# Patient Record
Sex: Female | Born: 1975
Health system: Southern US, Community
[De-identification: ages and names within clinical notes are randomized; demographics above are authoritative.]

## PROBLEM LIST (undated history)

## (undated) DIAGNOSIS — D573 Sickle-cell trait: Secondary | ICD-10-CM

## (undated) DIAGNOSIS — O139 Gestational [pregnancy-induced] hypertension without significant proteinuria, unspecified trimester: Secondary | ICD-10-CM

---

## 2016-03-02 ENCOUNTER — Other Ambulatory Visit (HOSPITAL_COMMUNITY): Payer: Self-pay | Admitting: Obstetrics and Gynecology

## 2016-03-02 ENCOUNTER — Encounter (HOSPITAL_COMMUNITY): Payer: Self-pay | Admitting: Obstetrics and Gynecology

## 2016-03-02 DIAGNOSIS — O09522 Supervision of elderly multigravida, second trimester: Secondary | ICD-10-CM

## 2016-03-02 DIAGNOSIS — E669 Obesity, unspecified: Secondary | ICD-10-CM

## 2016-03-02 DIAGNOSIS — Z3689 Encounter for other specified antenatal screening: Secondary | ICD-10-CM

## 2016-03-02 DIAGNOSIS — Z3A19 19 weeks gestation of pregnancy: Secondary | ICD-10-CM

## 2016-03-11 ENCOUNTER — Ambulatory Visit (HOSPITAL_COMMUNITY): Payer: Self-pay

## 2016-03-13 ENCOUNTER — Encounter (HOSPITAL_COMMUNITY): Payer: Self-pay

## 2016-03-13 ENCOUNTER — Ambulatory Visit (HOSPITAL_COMMUNITY)
Admission: RE | Admit: 2016-03-13 | Discharge: 2016-03-13 | Disposition: A | Discharge status: Home / Self Care | Payer: Medicaid Other | Source: Ambulatory Visit | Attending: Obstetrics and Gynecology | Admitting: Obstetrics and Gynecology

## 2016-03-13 DIAGNOSIS — Z3689 Encounter for other specified antenatal screening: Secondary | ICD-10-CM

## 2016-03-13 DIAGNOSIS — O99212 Obesity complicating pregnancy, second trimester: Secondary | ICD-10-CM | POA: Diagnosis not present

## 2016-03-13 DIAGNOSIS — Z315 Encounter for genetic counseling: Secondary | ICD-10-CM | POA: Diagnosis not present

## 2016-03-13 DIAGNOSIS — O09512 Supervision of elderly primigravida, second trimester: Secondary | ICD-10-CM | POA: Diagnosis not present

## 2016-03-13 DIAGNOSIS — E669 Obesity, unspecified: Secondary | ICD-10-CM

## 2016-03-13 DIAGNOSIS — Z3A19 19 weeks gestation of pregnancy: Secondary | ICD-10-CM | POA: Insufficient documentation

## 2016-03-13 DIAGNOSIS — O09522 Supervision of elderly multigravida, second trimester: Secondary | ICD-10-CM

## 2016-03-13 DIAGNOSIS — O09519 Supervision of elderly primigravida, unspecified trimester: Secondary | ICD-10-CM

## 2016-03-13 HISTORY — DX: Gestational (pregnancy-induced) hypertension without significant proteinuria, unspecified trimester: O13.9

## 2016-03-13 HISTORY — DX: Sickle-cell trait: D57.3

## 2016-03-13 IMAGING — US US MFM OB DETAIL+14 WK
1 series · 14 of 28 positions shown · non-contrast
Comparison: none

[Series 1: us mfm ob detail+14 wk · 104 acquisitions, 14 frames shown]
[im 4/104]
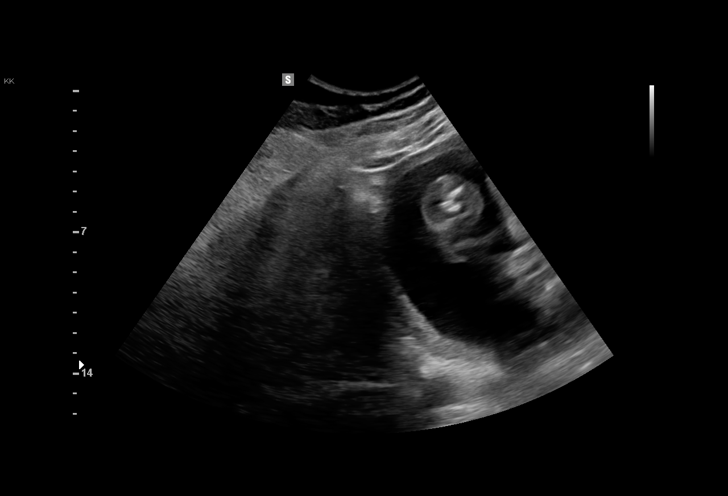
[im 12/104]
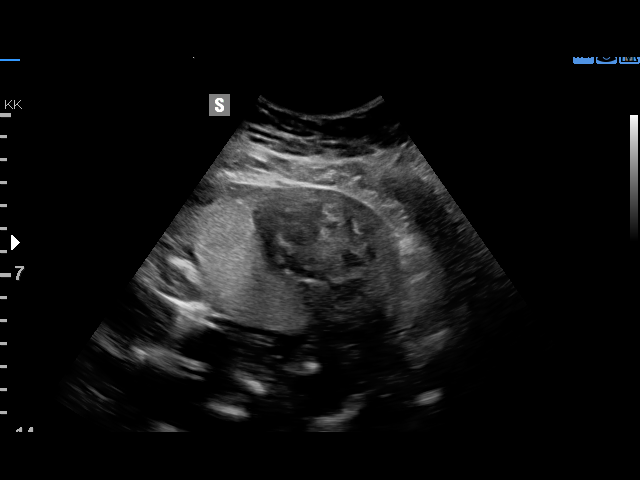
[im 20/104]
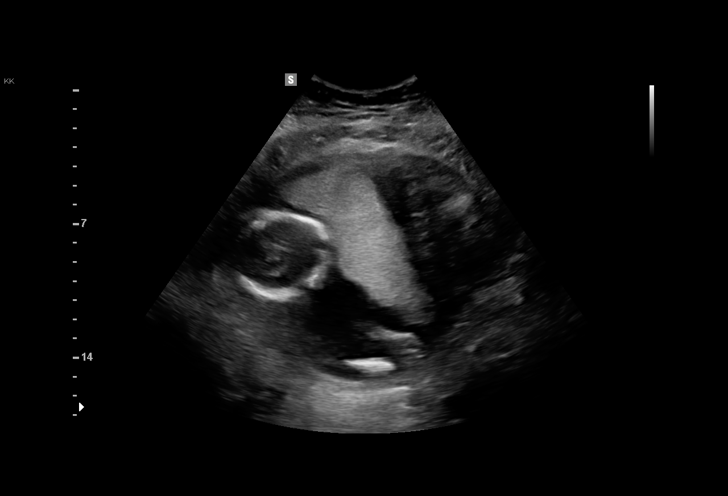
[im 27/104]
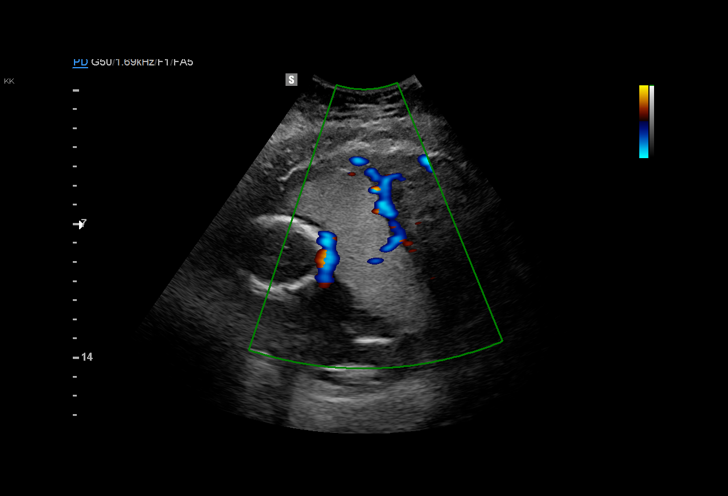
[im 35/104]
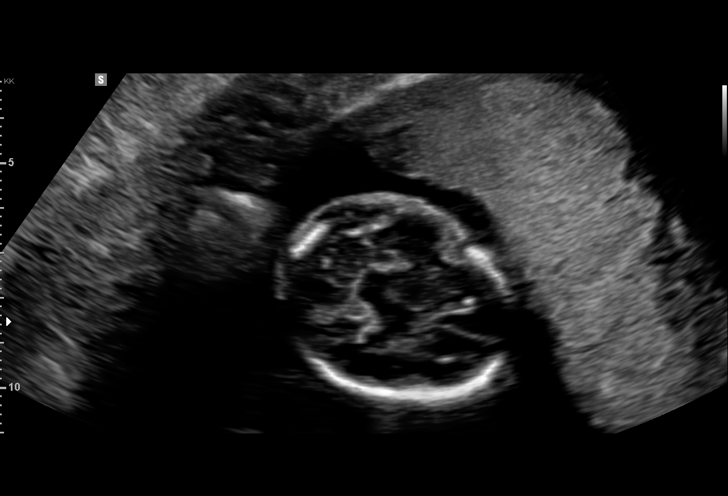
[im 42/104]
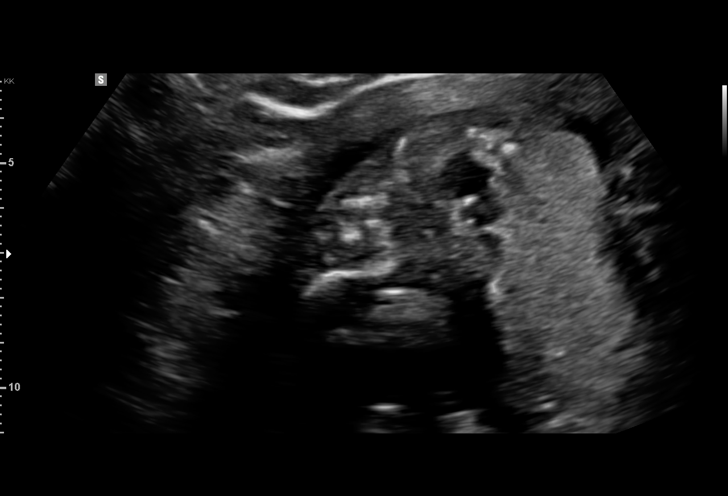
[im 50/104]
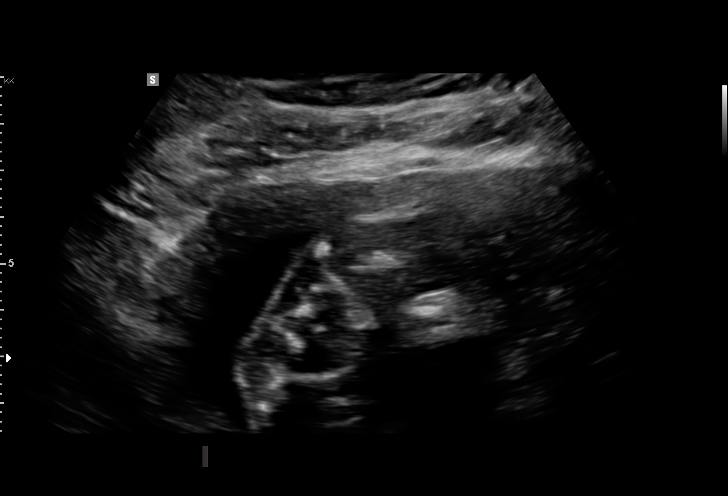
[im 58/104]
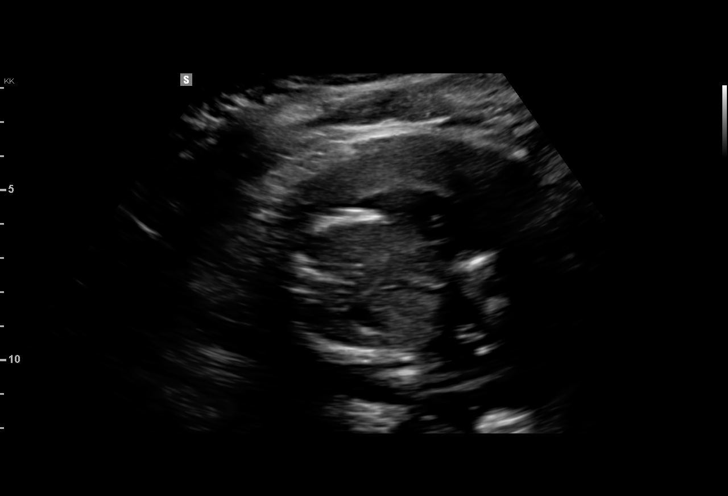
[im 65/104]
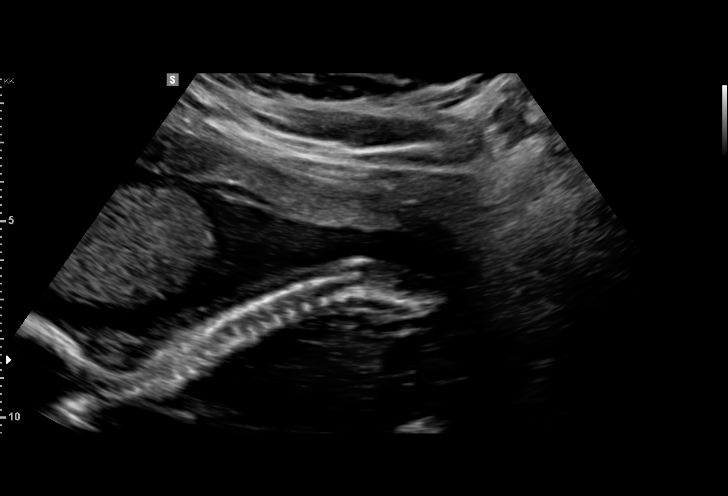
[im 73/104]
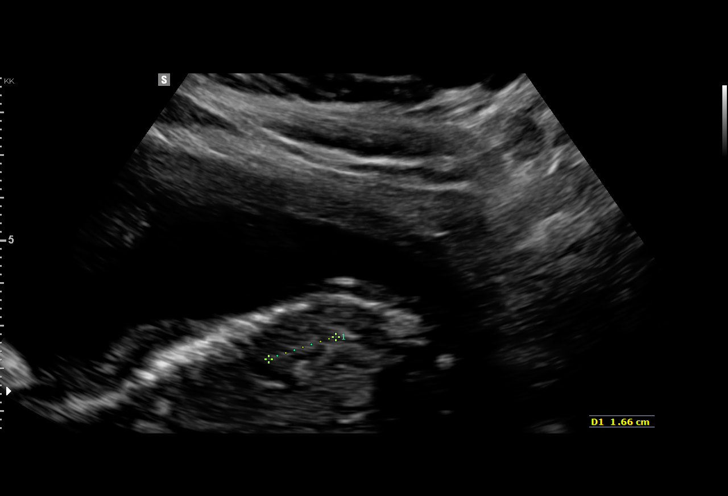
[im 81/104]
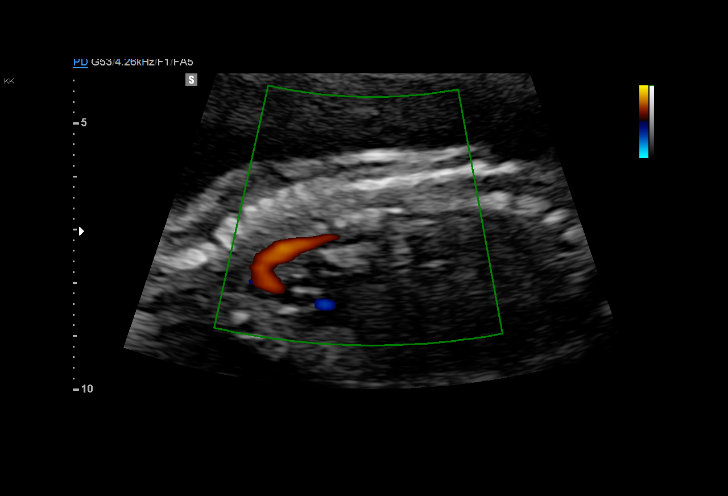
[im 88/104]
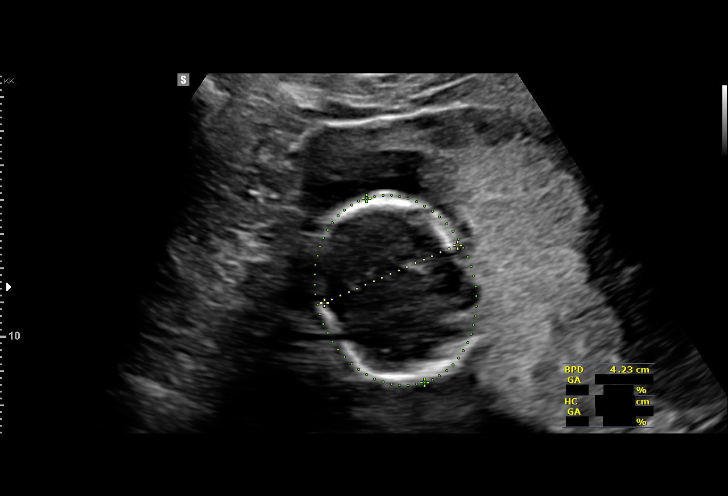
[im 96/104]
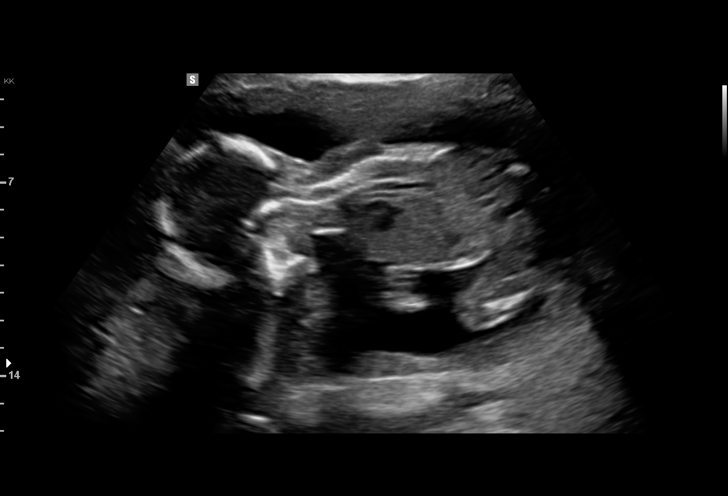
[im 104/104]
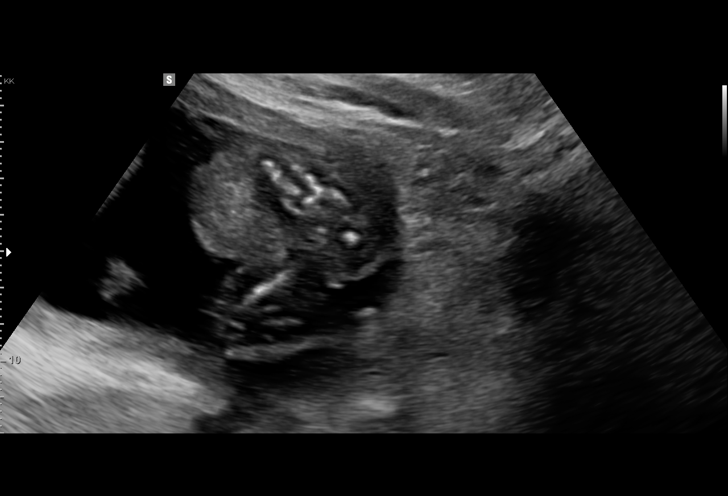

[14 of 28 positions shown; findings below may reference images not displayed]

1  AWEL KEDIR MAHAMEDI           [id number]      [id number]     [id number]
Indications

19 weeks gestation of pregnancy
Obesity complicating pregnancy, second
trimester
History of sickle cell trait
Advanced maternal age primigravida 35+,
second trimester
OB History

Blood Type:            Height:  5'4"   Weight (lb):  344      BMI:
Gravidity:    1
Fetal Evaluation

Num Of Fetuses:     1
Fetal Heart         162
Rate(bpm):
Cardiac Activity:   Observed
Presentation:       Breech
Placenta:           Anterior, above cervical os
P. Cord Insertion:  Visualized

Amniotic Fluid
AFI FV:      Subjectively within normal limits

Largest Pocket(cm)
6.2
Biometry

BPD:      42.4  mm     G. Age:  18w 6d         21  %    CI:        70.79   %   70 - 86
FL/HC:      18.5   %   16.8 -
HC:      160.6  mm     G. Age:  18w 6d         15  %    HC/AC:      1.06       1.09 -
AC:      151.5  mm     G. Age:  20w 2d         71  %    FL/BPD:     70.0   %
FL:       29.7  mm     G. Age:  19w 1d         28  %    FL/AC:      19.6   %   20 - 24
HUM:      26.6  mm     G. Age:  18w 3d         21  %
NFT:       2.6  mm

Est. FW:     307  gm    0 lb 11 oz      50  %
Gestational Age

LMP:           19w 4d       Date:   10/28/15                 EDD:   08/03/16
U/S Today:     19w 2d                                        EDD:   08/05/16
Best:          19w 4d    Det. By:   LMP  (10/28/15)          EDD:   08/03/16
Anatomy

Cranium:               Appears normal         Aortic Arch:            Appears normal
Cavum:                 Not well visualized    Ductal Arch:            Not well visualized
Ventricles:            Appears normal         Diaphragm:              Appears normal
Choroid Plexus:        Appears normal         Stomach:                Appears normal, left
sided
Cerebellum:            Appears normal         Abdomen:                Appears normal
Posterior Fossa:       Appears normal         Abdominal Wall:         Not well visualized
Nuchal Fold:           Appears normal         Cord Vessels:           Appears normal (3
vessel cord)
Face:                  Not well visualized    Kidneys:                Appear normal
Lips:                  Not well visualized    Bladder:                Appears normal
Heart:                 Not well visualized    Spine:                  Appears normal
RVOT:                  Not well visualized    Upper Extremities:      Not well visualized
LVOT:                  Not well visualized    Lower Extremities:      Not well visualized

Other:  Technically difficult due to maternal habitus and fetal position.
Technically difficult due to early gestational age.
Cervix Uterus Adnexa

Cervix
Length:            3.6  cm.
Normal appearance by transabdominal scan.

Adnexa:       No abnormality visualized.
Myomas

Site                     L(cm)      W(cm)      D(cm)      Location
Anterior                 4.6        3.5        5
Anterior

Blood Flow                 RI        PI       Comments

Impression

Single IUP at 19w 4d
AMA, Obesity, elevated HCG on quad screen
Limited views of the fetal heart and face obtained
No gross anomalies noted
Ultrasound measurements are consistent with LMP
Uterine myomas noted as described above
Anterior placenta without previa
Normal amniotic fluid volume
Recommendations

Recommend follow-up ultrasound examination in 4 weeks to
reevalaute the fetal anatomy
Serial ultrasounds for growth due to elevatated HCG on quad
screen

## 2016-03-13 NOTE — Progress Notes (Signed)
Genetic Counseling  High-Risk Gestation Note  Appointment Date:  03/13/2016 Referred By: Armandina Stammer, MD Date of Birth:  Jul 04, 1976 Partner:  Cynthia Luna   Pregnancy History: G1P0 Estimated Date of Delivery: 08/03/16 Estimated Gestational Age: [redacted]w[redacted]d Attending: Benjaman Lobe, MD   Mrs. Cynthia Luna, and her husband, Mr. Cynthia Luna, were seen for genetic counseling regarding a maternal age of 40 y.o..  In summary:  Discussed AMA and associated risk for fetal aneuploidy  Reviewed results of Quad screen within normal range and reduced risks for fetal Down syndrome and Trisomy 18  Elevated hCG on Quad screen; offer third trimester growth ultrasound  Discussed additional options for screening  NIPS-patient declined  Ultrasound-performed today; Complete report under separate cover  Discussed diagnostic testing options  Amniocentesis-declined  Reviewed family history concerns- patient reported she has sickle cell trait  Father of the pregnancy reported that he had normal screening  Reviewed autosomal recessive inheritance of sickle cell anemia  Hemoglobin electrophoresis available to assess for hemoglobin variants in addition to hemoglobin S; Mr. Wyszynski declined today  Couple is comfortable with newborn screening for hemoglobinopathies   They were counseled regarding maternal age and the association with risk for chromosome conditions due to nondisjunction with aging of the ova.   We reviewed chromosomes, nondisjunction, and the associated 1 in 68 risk for fetal aneuploidy related to a maternal age of 40 y.o. at [redacted]w[redacted]d gestation.  They were counseled that the risk for aneuploidy decreases as gestational age increases, accounting for those pregnancies which spontaneously abort.  We specifically discussed Down syndrome (trisomy 45), trisomies 63 and 60, and sex chromosome aneuploidies (47,XXX and 47,XXY) including the common features and prognoses of each.   We reviewed the  results of Quad screening, which were previously performed and were within normal range. We discussed the associated reduction in risks for fetal Down syndrome (1 in 6,098), trisomy 18 (< 1 in 10,000), and open neural tube defects. They were counseled that screening tests are used to modify a patient's a priori risk for aneuploidy, typically based on age. This estimate provides a pregnancy specific risk assessment. We reviewed that this is not diagnostic and does not assess for all chromosome conditions.   We reviewed additional available screening options including noninvasive prenatal screening (NIPS)/cell free DNA (cfDNA) screening and detailed ultrasound.  They were counseled that screening tests are used to modify a patient's a priori risk for aneuploidy, typically based on age. This estimate provides a pregnancy specific risk assessment. We reviewed the benefits and limitations of each option. Specifically, we discussed the conditions for which each test screens, the detection rates, and false positive rates of each. They were also counseled regarding diagnostic testing via amniocentesis. We reviewed the approximate 1 in [id number] risk for complications from amniocentesis, including spontaneous pregnancy loss. We discussed the possible results that the tests might provide including: positive, negative, unanticipated, and no result. Finally, they were counseled regarding the cost of each option and potential out of pocket expenses. After consideration of all the options, they declined NIPS and amniocentesis.     A detailed ultrasound was performed today. The ultrasound report will be sent under separate cover. They understand that screening tests cannot rule out all birth defects or genetic syndromes. The patient was advised of this limitation and states she still does not want additional testing at this time.   Mrs. Cynthia Luna was provided with written information regarding sickle cell anemia (SCA)  including the carrier frequency and incidence in  the African-American population, the availability of carrier testing and prenatal diagnosis if indicated.  In addition, we discussed that hemoglobinopathies are routinely screened for as part of the [facility] newborn screening panel.  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 The father of the pregnancy reported that he had normal screening. We do not have medical records to confirm his report and to confirm if this was screening only for sickle cell trait (Hb S) or for additional hemoglobin variants.  We discussed that SCA affects the shape and function of the red blood cell by producing abnormal hemoglobin. They were counseled that hemoglobin is a protein in the RBCs that carries oxygen to the body's organs. Individuals who have SCA have a changes within the genes that codes for hemoglobin. This couple was counseled that SCA is inherited in an autosomal recessive manner, and occurs when both copies of the hemoglobin gene are changed and produce an abnormal hemoglobin S. Typically, one abnormal gene for the production of hemoglobin S is inherited from each parent. A carrier of SCA has one altered copy of the gene for hemoglobin and one typical working copy. Carriers of recessive conditions typically do not have symptoms related to the condition because they still have one functioning copy of the gene, and thus some production of the typical protein coded for by that gene.  Given the recessive inheritance, we discussed the importance of understanding Mr. Haft' carrier status in order to accurately predict the risk of a hemoglobinopathy in the fetus. We discussed that other hemoglobin variants (hemoglobin C), when combined with hemoglobin S, can cause a medically significant hemoglobinopathy. Mr. Recchia was offered the option of repeat testing (hemoglobin electrophoresis) to determine whether he has any hemoglobin variant, including SCT. He declined  this testing today. They understand that an accurate risk assessment cannot be performed without further information. However, assuming that Mr. Bolle does not have SCT or any other hemoglobin variant, the fetus would not be expected to have an increased risk for a hemoglobinopathy. We also reviewed the availability of newborn screening in [place] for hemoglobinopathies.   Both family histories were reviewed and found to be otherwise noncontributory for birth defects, intellectual disability, and known genetic conditions. Without further information regarding the provided family history, an accurate genetic risk cannot be calculated. Further genetic counseling is warranted if more information is obtained.  Mrs. Cynthia Luna denied exposure to environmental toxins or chemical agents. She denied the use of alcohol, tobacco or street drugs. She denied significant viral illnesses during the course of her pregnancy. Her medical and surgical histories were contributory for hypertension, for which she is currently treated with labetalol.   I counseled this couple regarding the above risks and available options.  The approximate face-to-face time with the genetic counselor was 45 minutes.  Chipper Oman, MS,  Certified Genetic Counselor 03/13/2016

## 2016-03-17 ENCOUNTER — Other Ambulatory Visit (HOSPITAL_COMMUNITY): Payer: Self-pay

## 2016-03-17 DIAGNOSIS — O09519 Supervision of elderly primigravida, unspecified trimester: Secondary | ICD-10-CM

## 2016-04-10 ENCOUNTER — Ambulatory Visit (HOSPITAL_COMMUNITY)
Admission: RE | Admit: 2016-04-10 | Discharge: 2016-04-10 | Disposition: A | Discharge status: Home / Self Care | Payer: Medicaid Other | Source: Ambulatory Visit | Attending: Obstetrics and Gynecology | Admitting: Obstetrics and Gynecology

## 2016-04-10 ENCOUNTER — Encounter (HOSPITAL_COMMUNITY): Payer: Self-pay

## 2016-04-10 VITALS — BP 117/86 | HR 92 | Wt 346.5 lb

## 2016-04-10 DIAGNOSIS — O3412 Maternal care for benign tumor of corpus uteri, second trimester: Secondary | ICD-10-CM | POA: Diagnosis not present

## 2016-04-10 DIAGNOSIS — O281 Abnormal biochemical finding on antenatal screening of mother: Secondary | ICD-10-CM | POA: Diagnosis not present

## 2016-04-10 DIAGNOSIS — O09512 Supervision of elderly primigravida, second trimester: Secondary | ICD-10-CM | POA: Diagnosis present

## 2016-04-10 DIAGNOSIS — O99212 Obesity complicating pregnancy, second trimester: Secondary | ICD-10-CM | POA: Diagnosis not present

## 2016-04-10 DIAGNOSIS — D259 Leiomyoma of uterus, unspecified: Secondary | ICD-10-CM | POA: Diagnosis not present

## 2016-04-10 DIAGNOSIS — Z3A23 23 weeks gestation of pregnancy: Secondary | ICD-10-CM | POA: Diagnosis not present

## 2016-04-10 DIAGNOSIS — O09519 Supervision of elderly primigravida, unspecified trimester: Secondary | ICD-10-CM

## 2016-04-10 DIAGNOSIS — Z862 Personal history of diseases of the blood and blood-forming organs and certain disorders involving the immune mechanism: Secondary | ICD-10-CM | POA: Insufficient documentation

## 2016-04-10 IMAGING — US US MFM OB FOLLOW-UP
1 series · 14 of 28 positions shown · non-contrast
Comparison: none

[Series 1: us mfm ob follow-up · 61 acquisitions, 14 frames shown]
[im 3/61]
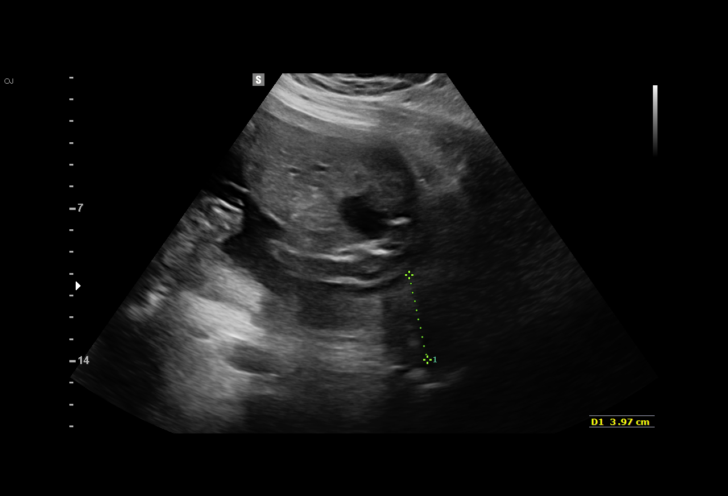
[im 7/61]
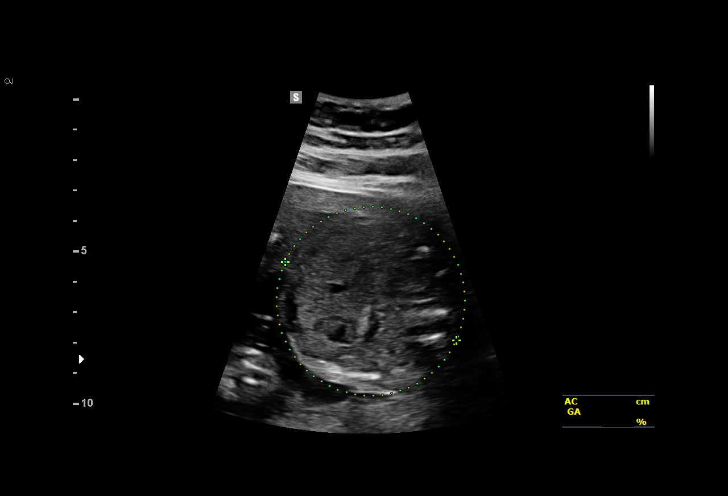
[im 12/61]
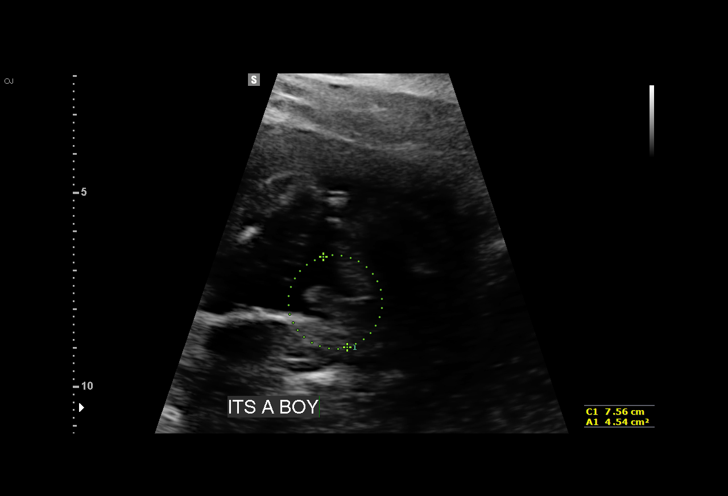
[im 16/61]
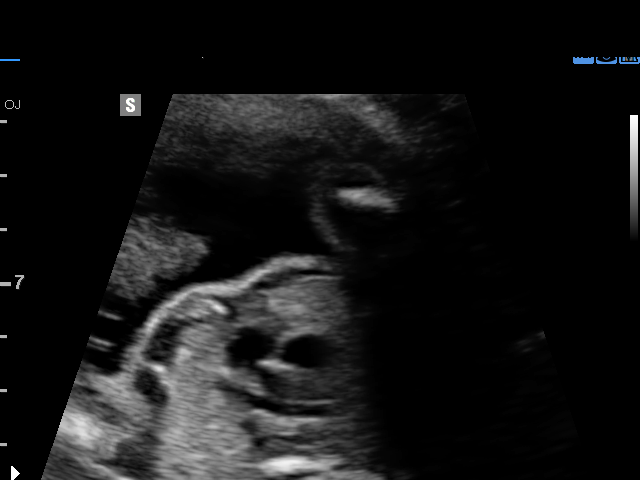
[im 21/61]
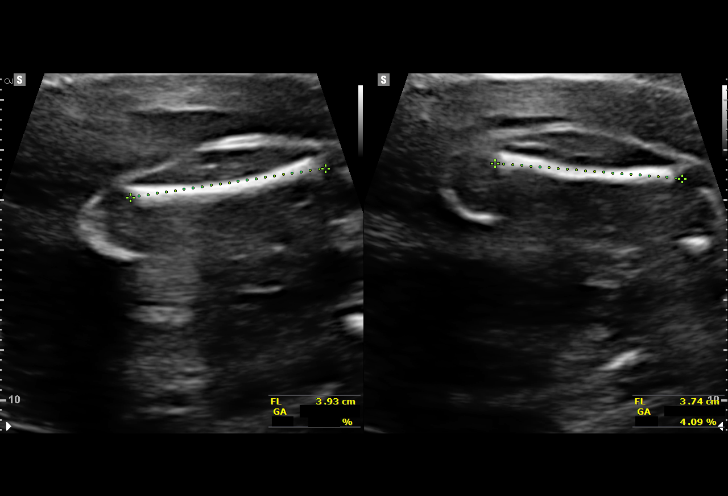
[im 25/61]
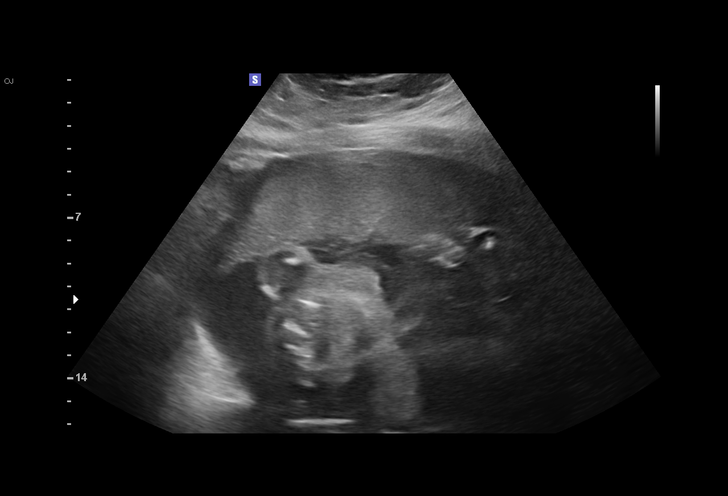
[im 29/61]
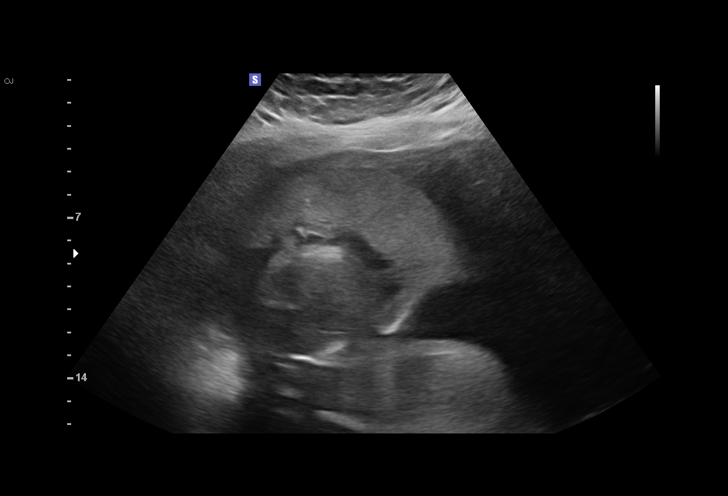
[im 34/61]
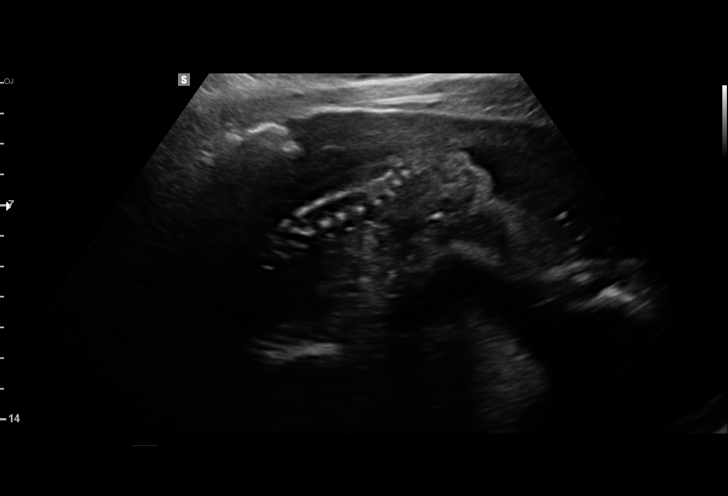
[im 38/61]
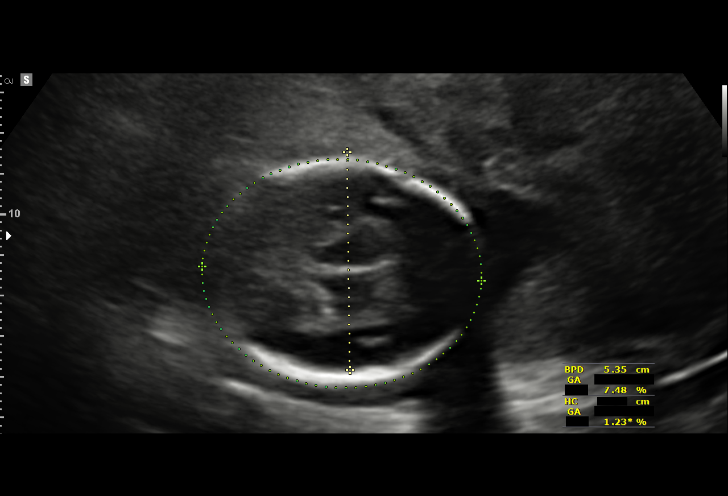
[im 43/61]
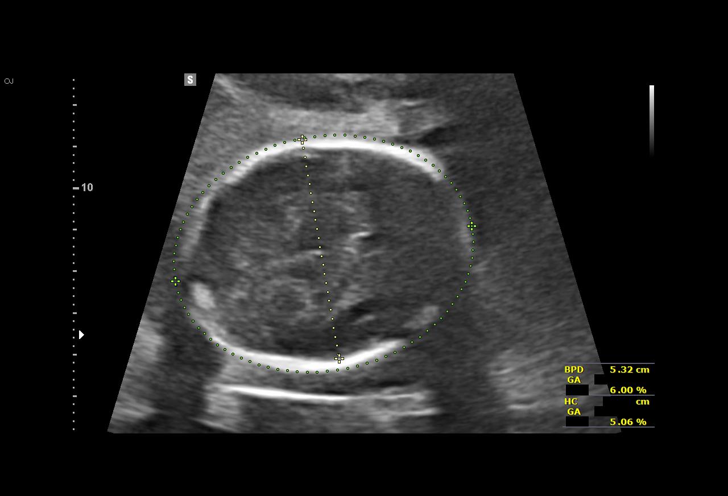
[im 47/61]
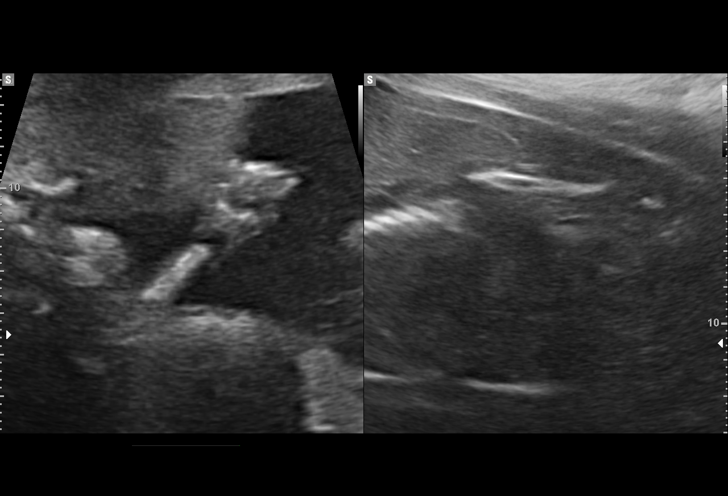
[im 52/61]
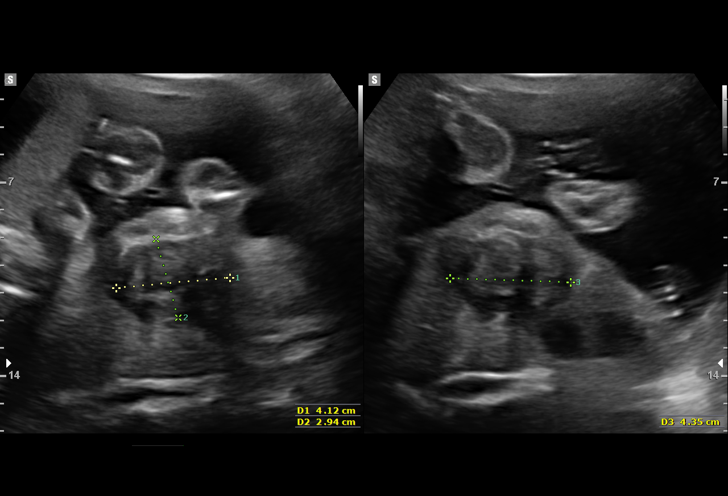
[im 56/61]
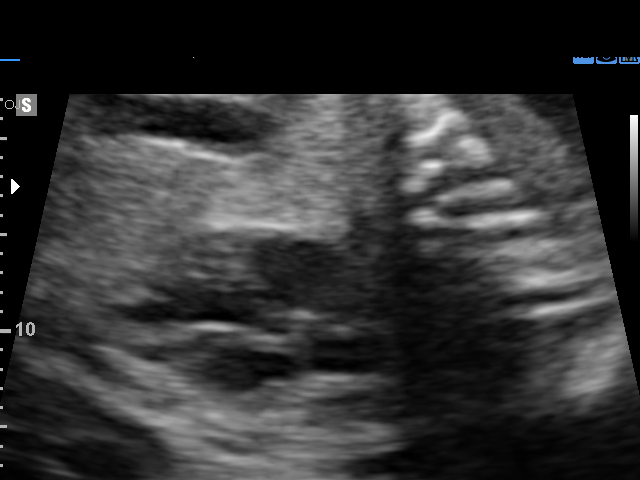
[im 61/61]
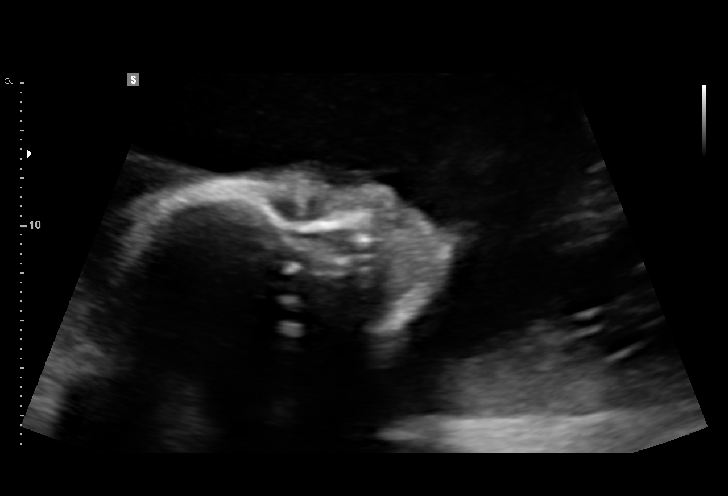

[14 of 28 positions shown; findings below may reference images not displayed]

1  [facility]            [PHONE_NUMBER]      [PHONE_NUMBER]     [PHONE_NUMBER]
Indications

23 weeks gestation of pregnancy
Obesity complicating pregnancy, second         [JE]
trimester
History of sickle cell trait                   [JE]
Advanced maternal age primigravida 35+,        [JE]
second trimester- neg quad screen, declined
further testing
Uterine fibroids affecting pregnancy in        O34.12, [JE]
second trimester, antepartum
Abnormal biochemical finding on antenatal      [JE]
screening of mother; elevated hCG
OB History

Blood Type:            Height:  5'4"   Weight (lb):  344       BMI:
Gravidity:    1
Fetal Evaluation

Num Of Fetuses:     1
Fetal Heart         150
Rate(bpm):
Cardiac Activity:   Observed
Presentation:       Breech
Placenta:           Anterior, above cervical os
P. Cord Insertion:  Visualized

Amniotic Fluid
AFI FV:      Subjectively within normal limits

Largest Pocket(cm)
7.0
Biometry

BPD:      53.8  mm     G. Age:  22w 2d          9  %    CI:         69.84  %    70 - 86
FL/HC:       19.0  %    18.7 -
HC:      205.4  mm     G. Age:  22w 4d          8  %    HC/AC:       1.08       1.05 -
AC:      190.9  mm     G. Age:  23w 6d         50  %    FL/BPD:      72.5  %    71 - 87
FL:         39  mm     G. Age:  22w 4d         11  %    FL/AC:       20.4  %    20 - 24
HUM:        40  mm     G. Age:  24w 2d         60  %

Est. FW:     568   gm     1 lb 4 oz     43  %
Gestational Age

LMP:           23w 4d        Date:  [DATE]                 EDD:    [DATE]
U/S Today:     22w 6d                                        EDD:    [DATE]
Best:          23w 4d     Det. By:  LMP  ([DATE])          EDD:    [DATE]
Anatomy

Cranium:               Appears normal         Aortic Arch:            Appears normal
Cavum:                 Appears normal         Ductal Arch:            Appears normal
Ventricles:            Appears normal         Diaphragm:              Previously seen
Choroid Plexus:        Previously seen        Stomach:                Previously Seen
Cerebellum:            Previously seen        Abdomen:                Appears normal
Posterior Fossa:       Previously seen        Abdominal Wall:         Not well visualized
Nuchal Fold:           Previously seen        Cord Vessels:           Appears normal (3
vessel cord)
Face:                  Orbits nl; profile not Kidneys:                Appear normal
well visualized
Lips:                  Appears normal         Bladder:                Appears normal
Thoracic:              Appears normal         Spine:                  Appears normal
Heart:                 Not well visualized    Upper Extremities:      Appears normal
RVOT:                  Appears normal         Lower Extremities:      Appears normal
LVOT:                  Appears normal

Other:  Technically difficult due to maternal habitus and fetal position.
Technically difficult due to early gestational age.
Cervix Uterus Adnexa

Cervix
Length:              4  cm.
Normal appearance by transabdominal scan.
Myomas

Site                     L(cm)      W(cm)      D(cm)       Location
Anterior                 7.7        5.1        6.3         Intramural
Anterior                 5.2        4.3        4.9         Intramural
Posterior                4.1        2.9        4.4         Intramural
Posterior                3.1        1.9        3.6         Intramural

Blood Flow                 RI        PI       Comments
Impression

SIUP at 23+4 weeks
Normal interval anatomy; anatomic survey complete except
for 4-chamber view, profile and CI
Normal amniotic fluid volume
Appropriate interval growth with EFW at the 43rd %tile
Fibroid uterus: see above for size and location
Recommendations

Serial ultrasounds for growth
We would be happy to help perform these exams.  If desired,
please call to schedule.

## 2017-01-16 ENCOUNTER — Encounter (HOSPITAL_COMMUNITY): Payer: Self-pay

## 2020-08-15 DIAGNOSIS — D72829 Elevated white blood cell count, unspecified: Secondary | ICD-10-CM | POA: Diagnosis not present

## 2020-10-02 ENCOUNTER — Telehealth: Payer: Self-pay | Admitting: Oncology

## 2020-10-02 NOTE — Telephone Encounter (Signed)
Patient referred by Dr Ernestene Kiel for Leukocytosis.  Appt made for 10/10/2020 Labs 2:45 pm - Consult 3:15 pm - Patient notified

## 2020-10-09 ENCOUNTER — Other Ambulatory Visit: Payer: Self-pay | Admitting: Oncology

## 2020-10-09 DIAGNOSIS — D72829 Elevated white blood cell count, unspecified: Secondary | ICD-10-CM

## 2020-10-09 NOTE — Progress Notes (Signed)
[facility]  [address] [place],  [place]  [address] [phone]  Clinic Day:  10/10/2020  Referring physician: Ernestene Kiel, MD   HISTORY OF PRESENT ILLNESS:  The patient is a 44 y.o. female  who I was asked to consult upon for leukocytosis.  Recent labs showed a mildly elevated white count of 11.7.  The patient denies having any recent infections or illnesses which could have triggered her elevated white count.  She denies being on any steroids or other medications which can trigger leukocytosis.  She also denies having undergone a previous splenectomy.  To her knowledge, there is no family history of any hematologic disorders.  However, recent testing did show that she has the sickle cell trait.  Overall, the patient denies there being any significant changes in her health over these past months.    PAST MEDICAL HISTORY:   Past Medical History:  Diagnosis Date  . Pregnancy induced hypertension   . Sickle cell trait ([facility])     PAST SURGICAL HISTORY:  Bilateral tubal ligation  CURRENT MEDICATIONS:   Current Outpatient Medications  Medication Sig Dispense Refill  . Cholecalciferol (VITAMIN D PO) Take by mouth.    . LABETALOL HCL PO Take by mouth.    . Prenatal Vit w/Fe-Methylfol-FA (PNV PO) Take by mouth.     No current facility-administered medications for this visit.    ALLERGIES:  No Known Allergies  FAMILY HISTORY:  Her father died from a heart attack.  Her mother has end-stage renal disease.  2 of her 3 sisters have the sickle cell trait.  A paternal uncle died from lung cancer.  SOCIAL HISTORY:  The patient was born and raised in [place].  She lives in [place] with her husband of 6 years.  They have 2 children.  She is an Astronomer.  There is no history of alcohol or tobacco abuse.    REVIEW OF SYSTEMS:  Review of Systems  Constitutional: Positive for fatigue. Negative for fever.  HENT:    Negative for hearing loss and sore throat.   Eyes: Negative for eye problems.  Respiratory: Negative for chest tightness, cough and hemoptysis.   Cardiovascular: Negative for chest pain and palpitations.  Gastrointestinal: Negative for abdominal distention, abdominal pain, blood in stool, constipation, diarrhea, nausea and vomiting.  Endocrine: Negative for hot flashes.  Genitourinary: Negative for difficulty urinating, dysuria, frequency, hematuria and nocturia.   Musculoskeletal: Positive for arthralgias. Negative for back pain, gait problem and myalgias.  Skin: Negative.  Negative for itching and rash.  Neurological: Negative.  Negative for dizziness, extremity weakness, gait problem, headaches, light-headedness and numbness.  Hematological: Negative.   Psychiatric/Behavioral: Negative.  Negative for depression and suicidal ideas. The patient is not nervous/anxious.      PHYSICAL EXAM:  Blood pressure (!) 143/93, pulse 98, temperature 98.8 F (37.1 C), resp. rate 16, height 5' 4.5" (1.638 m), weight (!) 340 lb 1.6 oz (154.3 kg), SpO2 98 %, unknown if currently breastfeeding. Wt Readings from Last 3 Encounters:  10/10/20 (!) 340 lb 1.6 oz (154.3 kg)  04/10/16 (!) 346 lb 8 oz (157.2 kg)   Body mass index is 57.48 kg/m. Performance status (ECOG): 0 - Asymptomatic Physical Exam Constitutional:      Appearance: Normal appearance. She is not ill-appearing.  HENT:     Mouth/Throat:     Mouth: Mucous membranes are moist.     Pharynx: Oropharynx is clear. No oropharyngeal exudate or posterior oropharyngeal  erythema.  Cardiovascular:     Rate and Rhythm: Normal rate and regular rhythm.     Heart sounds: No murmur heard. No friction rub. No gallop.   Pulmonary:     Effort: Pulmonary effort is normal. No respiratory distress.     Breath sounds: Normal breath sounds. No wheezing, rhonchi or rales.  Chest:  Breasts:     Right: No axillary adenopathy or supraclavicular adenopathy.      Left: No axillary adenopathy or supraclavicular adenopathy.    Abdominal:     General: Bowel sounds are normal. There is no distension.     Palpations: Abdomen is soft. There is no mass.     Tenderness: There is no abdominal tenderness.  Musculoskeletal:        General: No swelling.     Right lower leg: No edema.     Left lower leg: No edema.  Lymphadenopathy:     Cervical: No cervical adenopathy.     Upper Body:     Right upper body: No supraclavicular or axillary adenopathy.     Left upper body: No supraclavicular or axillary adenopathy.     Lower Body: No right inguinal adenopathy. No left inguinal adenopathy.  Skin:    General: Skin is warm.     Coloration: Skin is not jaundiced.     Findings: No lesion or rash.  Neurological:     General: No focal deficit present.     Mental Status: She is alert and oriented to person, place, and time. Mental status is at baseline.     Cranial Nerves: Cranial nerves are intact.  Psychiatric:        Mood and Affect: Mood normal.        Behavior: Behavior normal.        Thought Content: Thought content normal.     LABS:    ASSESSMENT & PLAN:  A 44 y.o. female who I was asked to consult upon for leukcocytosis.  When evaluating her hospital labs dating back to 2017, her white count has always been mildly elevated.  Furthermore, her platelet count has also gotten higher over these past few years.  This raises the suspicion for a possible myeloproliferative disorder.  I will check a JAK2 mutation today; if positive, this would strongly suggest a myeloproliferative disorder such as essential thrombocythemia or polycythemia rubra vera is present.  Although her hemoglobin is normal, her MCV is low.  Based upon this, her iron parameters will be checked today, particularly as iron deficiency can lead to a reactive thrombocytosis.  Altogether, none of her peripheral counts is dangerously elevated to where immediate is warranted.  I will see her back in 3  weeks to go over all of her labs collected today, as well as to reassess her peripheral counts at that time.  The patient understands all the plans discussed today and is in agreement with them.  I do appreciate Ernestene Kiel, MD for his new consult.   Callen Zuba Macarthur Critchley, MD

## 2020-10-10 ENCOUNTER — Other Ambulatory Visit: Payer: Self-pay | Admitting: Hematology and Oncology

## 2020-10-10 ENCOUNTER — Inpatient Hospital Stay (INDEPENDENT_AMBULATORY_CARE_PROVIDER_SITE_OTHER): Payer: BC Managed Care – PPO | Admitting: Oncology

## 2020-10-10 ENCOUNTER — Other Ambulatory Visit: Payer: Self-pay

## 2020-10-10 ENCOUNTER — Other Ambulatory Visit: Payer: Self-pay | Admitting: Oncology

## 2020-10-10 ENCOUNTER — Inpatient Hospital Stay: Payer: BC Managed Care – PPO | Attending: Oncology

## 2020-10-10 VITALS — BP 143/93 | HR 98 | Temp 98.8°F | Resp 16 | Ht 64.5 in | Wt 340.1 lb

## 2020-10-10 DIAGNOSIS — D573 Sickle-cell trait: Secondary | ICD-10-CM

## 2020-10-10 DIAGNOSIS — D72829 Elevated white blood cell count, unspecified: Secondary | ICD-10-CM

## 2020-10-10 DIAGNOSIS — Z0001 Encounter for general adult medical examination with abnormal findings: Secondary | ICD-10-CM | POA: Diagnosis not present

## 2020-10-10 DIAGNOSIS — D509 Iron deficiency anemia, unspecified: Secondary | ICD-10-CM

## 2020-10-10 LAB — CBC AND DIFFERENTIAL
HCT: 38 (ref 36–46)
Hemoglobin: 12.5 (ref 12.0–16.0)
Neutrophils Absolute: 10.51
Platelets: 535 — AB (ref 150–399)
WBC: 14.6

## 2020-10-10 LAB — CBC
MCV: 77 — AB (ref 81–99)
RBC: 4.9 (ref 3.87–5.11)

## 2020-10-14 ENCOUNTER — Encounter: Payer: Self-pay | Admitting: Oncology

## 2020-10-29 ENCOUNTER — Telehealth: Payer: Self-pay | Admitting: Hematology and Oncology

## 2020-10-29 NOTE — Telephone Encounter (Signed)
Patient called to Confirm Appts for  1/13 Labs 2:30 pm - Follow Up w/Kelli 3:00 pm

## 2020-10-30 ENCOUNTER — Other Ambulatory Visit: Payer: Self-pay | Admitting: Hematology and Oncology

## 2020-10-30 DIAGNOSIS — D72829 Elevated white blood cell count, unspecified: Secondary | ICD-10-CM

## 2020-10-31 ENCOUNTER — Inpatient Hospital Stay: Payer: BC Managed Care – PPO

## 2020-10-31 ENCOUNTER — Inpatient Hospital Stay: Payer: BC Managed Care – PPO | Admitting: Hematology and Oncology

## 2020-10-31 ENCOUNTER — Inpatient Hospital Stay: Payer: BC Managed Care – PPO | Admitting: Oncology

## 2020-11-05 ENCOUNTER — Telehealth: Payer: Self-pay | Admitting: Oncology

## 2020-11-05 NOTE — Telephone Encounter (Signed)
1/18 resch appts due to weather

## 2020-11-06 ENCOUNTER — Inpatient Hospital Stay: Payer: BC Managed Care – PPO

## 2020-11-12 ENCOUNTER — Other Ambulatory Visit: Payer: Self-pay

## 2020-11-12 ENCOUNTER — Inpatient Hospital Stay: Payer: BC Managed Care – PPO | Attending: Oncology

## 2020-11-12 DIAGNOSIS — D72829 Elevated white blood cell count, unspecified: Secondary | ICD-10-CM | POA: Diagnosis not present

## 2020-11-12 DIAGNOSIS — D573 Sickle-cell trait: Secondary | ICD-10-CM | POA: Diagnosis not present

## 2020-11-25 ENCOUNTER — Ambulatory Visit: Payer: BLUE CROSS/BLUE SHIELD | Admitting: Oncology

## 2020-11-28 LAB — CALR + JAK2 E12-15 + MPL (REFLEXED)

## 2020-11-28 LAB — JAK2 V617F, W REFLEX TO CALR/E12/MPL

## 2020-12-02 ENCOUNTER — Encounter: Payer: Self-pay | Admitting: Oncology

## 2020-12-02 ENCOUNTER — Other Ambulatory Visit: Payer: Self-pay

## 2020-12-02 ENCOUNTER — Inpatient Hospital Stay: Payer: BC Managed Care – PPO

## 2020-12-02 ENCOUNTER — Other Ambulatory Visit: Payer: Self-pay | Admitting: Hematology and Oncology

## 2020-12-02 ENCOUNTER — Other Ambulatory Visit: Payer: Self-pay | Admitting: Oncology

## 2020-12-02 ENCOUNTER — Inpatient Hospital Stay: Payer: BC Managed Care – PPO | Attending: Oncology | Admitting: Oncology

## 2020-12-02 VITALS — BP 158/92 | HR 105 | Temp 98.1°F | Resp 16 | Ht 64.5 in | Wt 337.0 lb

## 2020-12-02 DIAGNOSIS — D72829 Elevated white blood cell count, unspecified: Secondary | ICD-10-CM

## 2020-12-02 DIAGNOSIS — D509 Iron deficiency anemia, unspecified: Secondary | ICD-10-CM

## 2020-12-02 LAB — CBC AND DIFFERENTIAL
HCT: 37 (ref 36–46)
Hemoglobin: 11.9 — AB (ref 12.0–16.0)
Neutrophils Absolute: 6.91
Platelets: 474 — AB (ref 150–399)
WBC: 9.6

## 2020-12-02 LAB — CBC: RBC: 4.76 (ref 3.87–5.11)

## 2020-12-02 NOTE — Progress Notes (Signed)
[facility]  [address] [place],  [place]  [address] [phone]  Clinic Day:  12/02/2020  Referring physician: Ernestene Kiel, MD   HISTORY OF PRESENT ILLNESS:  The patient is a 45 y.o. female  who I recently began seeing for leukocytosis.  She comes in today to reassess her white count.  Since her last visit, the patient has been doing fine.  The patient denies having any recent infections or illnesses which concern her for an infection having triggered her elevated white count.  She denies having any B symptoms which concern her for an underlying hematologic malignancy being behind her leukocytosis.  Of note, she also had an elevated platelet count at her initial visit.  These elevated counts raised the suspicion for a myeloproliferative disorder potentially being present.  As it pertains to her elevated platelets, she denies having any underlying clotting complications.    PHYSICAL EXAM:  Blood pressure (!) 158/92, pulse (!) 105, temperature 98.1 F (36.7 C), resp. rate 16, height 5' 4.5" (1.638 m), weight (!) 337 lb (152.9 kg), SpO2 99 %, unknown if currently breastfeeding. Wt Readings from Last 3 Encounters:  12/02/20 (!) 337 lb (152.9 kg)  10/10/20 (!) 340 lb 1.6 oz (154.3 kg)  04/10/16 (!) 346 lb 8 oz (157.2 kg)   Body mass index is 56.95 kg/m. Performance status (ECOG): 0 - Asymptomatic Physical Exam Constitutional:      Appearance: Normal appearance. She is not ill-appearing.  HENT:     Mouth/Throat:     Mouth: Mucous membranes are moist.     Pharynx: Oropharynx is clear. No oropharyngeal exudate or posterior oropharyngeal erythema.  Cardiovascular:     Rate and Rhythm: Normal rate and regular rhythm.     Heart sounds: No murmur heard. No friction rub. No gallop.   Pulmonary:     Effort: Pulmonary effort is normal. No respiratory distress.     Breath sounds: Normal breath sounds. No wheezing, rhonchi or rales.  Chest:   Breasts:     Right: No axillary adenopathy or supraclavicular adenopathy.     Left: No axillary adenopathy or supraclavicular adenopathy.    Abdominal:     General: Bowel sounds are normal. There is no distension.     Palpations: Abdomen is soft. There is no mass.     Tenderness: There is no abdominal tenderness.  Musculoskeletal:        General: No swelling.     Right lower leg: No edema.     Left lower leg: No edema.  Lymphadenopathy:     Cervical: No cervical adenopathy.     Upper Body:     Right upper body: No supraclavicular or axillary adenopathy.     Left upper body: No supraclavicular or axillary adenopathy.     Lower Body: No right inguinal adenopathy. No left inguinal adenopathy.  Skin:    General: Skin is warm.     Coloration: Skin is not jaundiced.     Findings: No lesion or rash.  Neurological:     General: No focal deficit present.     Mental Status: She is alert and oriented to person, place, and time. Mental status is at baseline.     Cranial Nerves: Cranial nerves are intact.  Psychiatric:        Mood and Affect: Mood normal.        Behavior: Behavior normal.        Thought Content: Thought content normal.  LABS:    Ref. Range 12/02/2020 00:00  WBC Unknown 9.6  RBC Latest Ref Range: 3.87 - 5.11  4.76  Hemoglobin Latest Ref Range: 12.0 - 16.0  11.9 (A)  HCT Latest Ref Range: 36 - 46  37  Platelets Latest Ref Range: 150 - 399  474 (A)  NEUT# Unknown 6.91         ASSESSMENT & PLAN:  A 45 y.o. female who I recently began seeing for leukcocytosis.  I am pleased as her white count is normal today.  Furthermore, her platelets are lower than what they were previously.  All testing done for an underlying myeloproliferative disorder came back negative.  Furthermore, her iron parameters came back normal, likely ruling out iron deficiency as being a reason behind her elevated platelets.  As her counts are better and she is clinically doing fine, she will  continue to be followed conservatively.  I will see her back in 6 months for repeat clinical assessment.  The patient understands all the plans discussed today and is in agreement with them.  Dequincy Macarthur Critchley, MD

## 2021-05-14 DIAGNOSIS — H18601 Keratoconus, unspecified, right eye: Secondary | ICD-10-CM | POA: Diagnosis not present

## 2021-06-02 ENCOUNTER — Inpatient Hospital Stay: Payer: BC Managed Care – PPO | Admitting: Oncology

## 2021-06-02 ENCOUNTER — Inpatient Hospital Stay: Payer: BC Managed Care – PPO

## 2021-06-05 NOTE — Progress Notes (Signed)
[facility]  [address] [place],  [place]  [address] [phone]  Clinic Day:  06/13/2021  Referring physician: Ernestene Kiel, MD  This document serves as a record of services personally performed by Marice Potter, MD. It was created on their behalf by Curry,Lauren E, a trained medical scribe. The creation of this record is based on the scribe's personal observations and the provider's statements to them.  HISTORY OF PRESENT ILLNESS:  The patient is a 45 y.o. female  who I recently began seeing for leukocytosis.  She comes in today to reassess her white count.  Since her last visit, the patient has been doing fine.  The patient denies having any recent infections or illnesses which concern her for an infection having triggered her elevated white count.  She also continues to deny having any B symptoms which concern her for an underlying hematologic malignancy being behind her leukocytosis.    PHYSICAL EXAM:  Blood pressure (!) 171/87, pulse (!) 102, temperature 98.8 F (37.1 C), resp. rate 16, height 5' 4.5" (1.638 m), weight (!) 342 lb 4.8 oz (155.3 kg), SpO2 99 %, unknown if currently breastfeeding. Wt Readings from Last 3 Encounters:  06/13/21 (!) 342 lb 4.8 oz (155.3 kg)  12/02/20 (!) 337 lb (152.9 kg)  10/10/20 (!) 340 lb 1.6 oz (154.3 kg)   Body mass index is 57.85 kg/m. Performance status (ECOG): 0 - Asymptomatic Physical Exam Constitutional:      Appearance: Normal appearance. She is not ill-appearing.  HENT:     Mouth/Throat:     Mouth: Mucous membranes are moist.     Pharynx: Oropharynx is clear. No oropharyngeal exudate or posterior oropharyngeal erythema.  Cardiovascular:     Rate and Rhythm: Normal rate and regular rhythm.     Heart sounds: No murmur heard.   No friction rub. No gallop.  Pulmonary:     Effort: Pulmonary effort is normal. No respiratory distress.     Breath sounds: Normal breath sounds. No wheezing,  rhonchi or rales.  Abdominal:     General: Bowel sounds are normal. There is no distension.     Palpations: Abdomen is soft. There is no mass.     Tenderness: There is no abdominal tenderness.  Musculoskeletal:        General: No swelling.     Right lower leg: No edema.     Left lower leg: No edema.  Lymphadenopathy:     Cervical: No cervical adenopathy.     Upper Body:     Right upper body: No supraclavicular or axillary adenopathy.     Left upper body: No supraclavicular or axillary adenopathy.     Lower Body: No right inguinal adenopathy. No left inguinal adenopathy.  Skin:    General: Skin is warm.     Coloration: Skin is not jaundiced.     Findings: No lesion or rash.  Neurological:     General: No focal deficit present.     Mental Status: She is alert and oriented to person, place, and time. Mental status is at baseline.     Cranial Nerves: Cranial nerves are intact.  Psychiatric:        Mood and Affect: Mood normal.        Behavior: Behavior normal.        Thought Content: Thought content normal.    LABS:      Ref. Range 06/13/2021 13:16  Iron Latest Ref Range: 28 - 170 ug/dL  93  UIBC Latest Units: ug/dL 292  TIBC Latest Ref Range: 250 - 450 ug/dL 385  Saturation Ratios Latest Ref Range: 10.4 - 31.8 % 24  Ferritin Latest Ref Range: 11 - 307 ng/mL 39  Transferrin Receptor Latest Ref Range: 12.2 - 27.3 nmol/L 23.3    Ref. Range 06/13/2021 13:16  Hgb A Latest Ref Range: 96.4 - 98.8 % 57.7 (L)  Hgb A2 Latest Ref Range: 1.8 - 3.2 % 3.0  HGB F Latest Ref Range: 0.0 - 2.0 % 0.0  HGB S Latest Ref Range: 0.0 % 39.3 (H)  Hgb Solubility Latest Ref Range: Negative  Positive (A)   ASSESSMENT & PLAN:  A 45 y.o. female with leukocytosis.  Her white count is once again elevated, as are her platelets.  Of note, this patient had JAK2, JAK2 exon 12-15, calreticulin, and MPL testing, all of which came back negative for a potential myeloproliferative disorder being present.  As  microcytic anemia was present, iron studies were done today, which did not show iron deficiency.  A hemoglobin electrophoresis was also done to check for beta thalassemia, which came back negative.  However, she unexpectedly tested positive for having the sickle cell trail.  Despite her peripheral counts being abnormal, none of them is dangerously abnormal to where some ominous appears to be present.  Furthermore, she is clinically doing well.  As that is the case, I will see her back in 6 months for repeat clinical assessment.  The patient understands all the plans discussed today and is in agreement with them.   I, Rita Ohara, am acting as scribe for Marice Potter, MD    I have reviewed this report as typed by the medical scribe, and it is complete and accurate.  Sujata Maines Macarthur Critchley, MD

## 2021-06-13 ENCOUNTER — Inpatient Hospital Stay (INDEPENDENT_AMBULATORY_CARE_PROVIDER_SITE_OTHER): Payer: BC Managed Care – PPO | Admitting: Oncology

## 2021-06-13 ENCOUNTER — Inpatient Hospital Stay: Payer: BC Managed Care – PPO | Attending: Oncology

## 2021-06-13 ENCOUNTER — Encounter: Payer: Self-pay | Admitting: Oncology

## 2021-06-13 ENCOUNTER — Other Ambulatory Visit: Payer: Self-pay | Admitting: Oncology

## 2021-06-13 ENCOUNTER — Other Ambulatory Visit: Payer: Self-pay

## 2021-06-13 DIAGNOSIS — D72829 Elevated white blood cell count, unspecified: Secondary | ICD-10-CM | POA: Diagnosis not present

## 2021-06-13 DIAGNOSIS — D509 Iron deficiency anemia, unspecified: Secondary | ICD-10-CM

## 2021-06-13 DIAGNOSIS — I160 Hypertensive urgency: Secondary | ICD-10-CM | POA: Diagnosis not present

## 2021-06-13 DIAGNOSIS — R7989 Other specified abnormal findings of blood chemistry: Secondary | ICD-10-CM | POA: Diagnosis not present

## 2021-06-13 DIAGNOSIS — I10 Essential (primary) hypertension: Secondary | ICD-10-CM | POA: Diagnosis not present

## 2021-06-13 DIAGNOSIS — R42 Dizziness and giddiness: Secondary | ICD-10-CM | POA: Diagnosis not present

## 2021-06-13 DIAGNOSIS — D573 Sickle-cell trait: Secondary | ICD-10-CM

## 2021-06-13 DIAGNOSIS — R55 Syncope and collapse: Secondary | ICD-10-CM | POA: Diagnosis not present

## 2021-06-13 DIAGNOSIS — R059 Cough, unspecified: Secondary | ICD-10-CM | POA: Diagnosis not present

## 2021-06-13 DIAGNOSIS — R000 Tachycardia, unspecified: Secondary | ICD-10-CM | POA: Diagnosis not present

## 2021-06-13 LAB — IRON AND TIBC
Iron: 93 ug/dL (ref 28–170)
Saturation Ratios: 24 % (ref 10.4–31.8)
TIBC: 385 ug/dL (ref 250–450)
UIBC: 292 ug/dL

## 2021-06-13 LAB — CBC AND DIFFERENTIAL
HCT: 36 (ref 36–46)
Hemoglobin: 11.8 — AB (ref 12.0–16.0)
Neutrophils Absolute: 8.95
Platelets: 474 — AB (ref 150–399)
WBC: 12.6

## 2021-06-13 LAB — CBC: RBC: 4.7 (ref 3.87–5.11)

## 2021-06-13 LAB — FERRITIN: Ferritin: 39 ng/mL (ref 11–307)

## 2021-06-14 DIAGNOSIS — R059 Cough, unspecified: Secondary | ICD-10-CM | POA: Diagnosis not present

## 2021-06-14 LAB — SOLUBLE TRANSFERRIN RECEPTOR: Transferrin Receptor: 23.3 nmol/L (ref 12.2–27.3)

## 2021-06-16 LAB — HGB SOLUBILITY: Hgb Solubility: POSITIVE — AB

## 2021-06-16 LAB — HGB FRACTIONATION CASCADE
Hgb A2: 3 % (ref 1.8–3.2)
Hgb A: 57.7 % — ABNORMAL LOW (ref 96.4–98.8)
Hgb F: 0 % (ref 0.0–2.0)
Hgb S: 39.3 % — ABNORMAL HIGH

## 2021-06-17 DIAGNOSIS — D573 Sickle-cell trait: Secondary | ICD-10-CM | POA: Insufficient documentation

## 2021-06-17 DIAGNOSIS — R7989 Other specified abnormal findings of blood chemistry: Secondary | ICD-10-CM | POA: Insufficient documentation

## 2021-06-18 DIAGNOSIS — Z6841 Body Mass Index (BMI) 40.0 and over, adult: Secondary | ICD-10-CM | POA: Diagnosis not present

## 2021-06-18 DIAGNOSIS — R030 Elevated blood-pressure reading, without diagnosis of hypertension: Secondary | ICD-10-CM | POA: Diagnosis not present

## 2021-06-27 DIAGNOSIS — F411 Generalized anxiety disorder: Secondary | ICD-10-CM | POA: Diagnosis not present

## 2021-06-27 DIAGNOSIS — R030 Elevated blood-pressure reading, without diagnosis of hypertension: Secondary | ICD-10-CM | POA: Diagnosis not present

## 2021-08-24 DIAGNOSIS — N3001 Acute cystitis with hematuria: Secondary | ICD-10-CM | POA: Diagnosis not present

## 2021-08-24 DIAGNOSIS — R0981 Nasal congestion: Secondary | ICD-10-CM | POA: Diagnosis not present

## 2021-08-24 DIAGNOSIS — M791 Myalgia, unspecified site: Secondary | ICD-10-CM | POA: Diagnosis not present

## 2021-08-24 DIAGNOSIS — Z20828 Contact with and (suspected) exposure to other viral communicable diseases: Secondary | ICD-10-CM | POA: Diagnosis not present

## 2021-08-24 DIAGNOSIS — N390 Urinary tract infection, site not specified: Secondary | ICD-10-CM | POA: Diagnosis not present

## 2021-09-17 DIAGNOSIS — Z6841 Body Mass Index (BMI) 40.0 and over, adult: Secondary | ICD-10-CM | POA: Diagnosis not present

## 2021-09-17 DIAGNOSIS — J069 Acute upper respiratory infection, unspecified: Secondary | ICD-10-CM | POA: Diagnosis not present

## 2021-09-17 DIAGNOSIS — J358 Other chronic diseases of tonsils and adenoids: Secondary | ICD-10-CM | POA: Diagnosis not present

## 2021-09-17 DIAGNOSIS — F419 Anxiety disorder, unspecified: Secondary | ICD-10-CM | POA: Diagnosis not present

## 2021-11-28 DIAGNOSIS — H61303 Acquired stenosis of external ear canal, unspecified, bilateral: Secondary | ICD-10-CM | POA: Diagnosis not present

## 2021-11-28 DIAGNOSIS — H6123 Impacted cerumen, bilateral: Secondary | ICD-10-CM | POA: Diagnosis not present

## 2021-11-28 DIAGNOSIS — J358 Other chronic diseases of tonsils and adenoids: Secondary | ICD-10-CM | POA: Diagnosis not present

## 2021-11-28 DIAGNOSIS — Z8709 Personal history of other diseases of the respiratory system: Secondary | ICD-10-CM | POA: Diagnosis not present

## 2021-12-08 NOTE — Progress Notes (Incomplete)
[facility]  [address] [place],  [place]  [address] [phone]  Clinic Day:  12/12/2021  Referring physician: Ernestene Kiel, MD  This document serves as a record of services personally performed by Marice Potter, MD. It was created on their behalf by Curry,Lauren E, a trained medical scribe. The creation of this record is based on the scribe's personal observations and the provider's statements to them.  HISTORY OF PRESENT ILLNESS:  The patient is a 46 y.o. female  who I recently began seeing for leukocytosis.  She comes in today to reassess her white count.  Since her last visit, the patient has been doing fine.  The patient denies having any recent infections or illnesses which concern her for an infection having triggered her elevated white count.  She also continues to deny having any B symptoms which concern her for an underlying hematologic malignancy being behind her leukocytosis.    PHYSICAL EXAM:  unknown if currently breastfeeding. Wt Readings from Last 3 Encounters:  06/13/21 (!) 342 lb 4.8 oz (155.3 kg)  12/02/20 (!) 337 lb (152.9 kg)  10/10/20 (!) 340 lb 1.6 oz (154.3 kg)   There is no height or weight on file to calculate BMI. Performance status (ECOG): 0 - Asymptomatic Physical Exam Constitutional:      Appearance: Normal appearance. She is not ill-appearing.  HENT:     Mouth/Throat:     Mouth: Mucous membranes are moist.     Pharynx: Oropharynx is clear. No oropharyngeal exudate or posterior oropharyngeal erythema.  Cardiovascular:     Rate and Rhythm: Normal rate and regular rhythm.     Heart sounds: No murmur heard.   No friction rub. No gallop.  Pulmonary:     Effort: Pulmonary effort is normal. No respiratory distress.     Breath sounds: Normal breath sounds. No wheezing, rhonchi or rales.  Abdominal:     General: Bowel sounds are normal. There is no distension.     Palpations: Abdomen is soft. There is no  mass.     Tenderness: There is no abdominal tenderness.  Musculoskeletal:        General: No swelling.     Right lower leg: No edema.     Left lower leg: No edema.  Lymphadenopathy:     Cervical: No cervical adenopathy.     Upper Body:     Right upper body: No supraclavicular or axillary adenopathy.     Left upper body: No supraclavicular or axillary adenopathy.     Lower Body: No right inguinal adenopathy. No left inguinal adenopathy.  Skin:    General: Skin is warm.     Coloration: Skin is not jaundiced.     Findings: No lesion or rash.  Neurological:     General: No focal deficit present.     Mental Status: She is alert and oriented to person, place, and time. Mental status is at baseline.  Psychiatric:        Mood and Affect: Mood normal.        Behavior: Behavior normal.        Thought Content: Thought content normal.    LABS:      Ref. Range 06/13/2021 13:16  Iron Latest Ref Range: 28 - 170 ug/dL 93  UIBC Latest Units: ug/dL 292  TIBC Latest Ref Range: 250 - 450 ug/dL 385  Saturation Ratios Latest Ref Range: 10.4 - 31.8 % 24  Ferritin Latest Ref Range: 11 - 307  ng/mL 39  Transferrin Receptor Latest Ref Range: 12.2 - 27.3 nmol/L 23.3    Ref. Range 06/13/2021 13:16  Hgb A Latest Ref Range: 96.4 - 98.8 % 57.7 (L)  Hgb A2 Latest Ref Range: 1.8 - 3.2 % 3.0  HGB F Latest Ref Range: 0.0 - 2.0 % 0.0  HGB S Latest Ref Range: 0.0 % 39.3 (H)  Hgb Solubility Latest Ref Range: Negative  Positive (A)   ASSESSMENT & PLAN:  A 46 y.o. female with leukocytosis.  Her white count is once again elevated, as are her platelets.  Of note, this patient had JAK2, JAK2 exon 12-15, calreticulin, and MPL testing, all of which came back negative for a potential myeloproliferative disorder being present.  As microcytic anemia was present, iron studies were done today, which did not show iron deficiency.  A hemoglobin electrophoresis was also done to check for beta thalassemia, which came back  negative.  However, she unexpectedly tested positive for having the sickle cell trail.  Despite her peripheral counts being abnormal, none of them is dangerously abnormal to where some ominous appears to be present.  Furthermore, she is clinically doing well.  As that is the case, I will see her back in 6 months for repeat clinical assessment.  The patient understands all the plans discussed today and is in agreement with them.   I, Rita Ohara, am acting as scribe for Marice Potter, MD    I have reviewed this report as typed by the medical scribe, and it is complete and accurate.  Dequincy Macarthur Critchley, MD

## 2021-12-12 ENCOUNTER — Other Ambulatory Visit: Payer: BC Managed Care – PPO

## 2021-12-12 ENCOUNTER — Ambulatory Visit: Payer: BC Managed Care – PPO | Admitting: Oncology

## 2022-01-08 NOTE — Progress Notes (Signed)
?[facility]  ?[address] ?[place],  [place]  [address] ?[phone] ? ?Clinic Day:  01/09/2021 ? ?Referring physician: Ernestene Kiel, MD ? ?HISTORY OF PRESENT ILLNESS:  ?The patient is a 46 y.o. female who I follow for leukocytosis.  She has also had a chronically elevated platelet count.  Of note, all studies done in the past to check for an underlying myeloproliferative disorder came back negative.  She also has microcytic red blood cell indices.  A hemoglobin electrophoresis in the past did not show any evidence of beta thalassemia. However, it did show that she has the sickle cell trait.  Furthermore, iron studies in the past were normal.  She comes in today to reassess her peripheral counts.    Since her last visit, the patient has been doing fine.  She denies having any recent infections or being on any steroids, both of which can cause leukocytosis.  She also continues to deny having any B symptoms which concern her for an underlying hematologic malignancy being behind her leukocytosis.   ? ?PHYSICAL EXAM:  ?Blood pressure (!) 178/86, pulse 98, temperature 98.4 ?F (36.9 ?C), resp. rate 16, height 5' 4.5" (1.638 m), weight (!) 322 lb 9.6 oz (146.3 kg), SpO2 100 %, unknown if currently breastfeeding. ?Wt Readings from Last 3 Encounters:  ?01/09/22 (!) 322 lb 9.6 oz (146.3 kg)  ?06/13/21 (!) 342 lb 4.8 oz (155.3 kg)  ?12/02/20 (!) 337 lb (152.9 kg)  ? ?Body mass index is 54.52 kg/m?Marland Kitchen ?Performance status (ECOG): 0 - Asymptomatic ?Physical Exam ?Constitutional:   ?   Appearance: Normal appearance. She is not ill-appearing.  ?HENT:  ?   Mouth/Throat:  ?   Mouth: Mucous membranes are moist.  ?   Pharynx: Oropharynx is clear. No oropharyngeal exudate or posterior oropharyngeal erythema.  ?Cardiovascular:  ?   Rate and Rhythm: Normal rate and regular rhythm.  ?   Heart sounds: No murmur heard. ?  No friction rub. No gallop.  ?Pulmonary:  ?   Effort: Pulmonary effort is  normal. No respiratory distress.  ?   Breath sounds: Normal breath sounds. No wheezing, rhonchi or rales.  ?Abdominal:  ?   General: Bowel sounds are normal. There is no distension.  ?   Palpations: Abdomen is soft. There is no mass.  ?   Tenderness: There is no abdominal tenderness.  ?Musculoskeletal:     ?   General: No swelling.  ?   Right lower leg: No edema.  ?   Left lower leg: No edema.  ?Lymphadenopathy:  ?   Cervical: No cervical adenopathy.  ?   Upper Body:  ?   Right upper body: No supraclavicular or axillary adenopathy.  ?   Left upper body: No supraclavicular or axillary adenopathy.  ?   Lower Body: No right inguinal adenopathy. No left inguinal adenopathy.  ?Skin: ?   General: Skin is warm.  ?   Coloration: Skin is not jaundiced.  ?   Findings: No lesion or rash.  ?Neurological:  ?   General: No focal deficit present.  ?   Mental Status: She is alert and oriented to person, place, and time. Mental status is at baseline.  ?Psychiatric:     ?   Mood and Affect: Mood normal.     ?   Behavior: Behavior normal.     ?   Thought Content: Thought content normal.  ? ? ?LABS:  ? ? ? Latest Reference Range &  Units 01/09/22 13:47  ?Iron 28 - 170 ug/dL 50  ?UIBC ug/dL 311  ?TIBC 250 - 450 ug/dL 361  ?Saturation Ratios 10.4 - 31.8 % 14  ?Ferritin 11 - 307 ng/mL 43  ?Transferrin Receptor 12.2 - 27.3 nmol/L 23.2  ? ? ?ASSESSMENT & PLAN:  ?A 46 y.o. female with leukocytosis, thrombocythemia and microcytic red blood cell indices.  I am pleased as her both her white cells and platelets are lower than what they were previously.  With respect to her microcytic red blood cell indices, her iron parameters today continue to show no evidence of iron deficiency being present.  A hemoglobin electrophoresis done recently did not show any evidence of beta thalassemia being the reason behind her microcytic red cell indices.  Based upon this, I will also check her for alpha thalassemia today.  Otherwise, this patient does not have  any ominous hematologic issues at this time.  Clinically, she is doing well.  I will see her back in 6 months for repeat clinical assessment.  The patient understands all the plans discussed today and is in agreement with them. ? ? ?Mykira Hofmeister Macarthur Critchley, MD ?   ? ? ?  ?

## 2022-01-09 ENCOUNTER — Telehealth: Payer: Self-pay | Admitting: Oncology

## 2022-01-09 ENCOUNTER — Inpatient Hospital Stay (INDEPENDENT_AMBULATORY_CARE_PROVIDER_SITE_OTHER): Payer: BC Managed Care – PPO | Admitting: Oncology

## 2022-01-09 ENCOUNTER — Other Ambulatory Visit: Payer: Self-pay

## 2022-01-09 ENCOUNTER — Other Ambulatory Visit: Payer: Self-pay | Admitting: Oncology

## 2022-01-09 ENCOUNTER — Inpatient Hospital Stay: Payer: BC Managed Care – PPO | Attending: Oncology

## 2022-01-09 VITALS — BP 178/86 | HR 98 | Temp 98.4°F | Resp 16 | Ht 64.5 in | Wt 322.6 lb

## 2022-01-09 DIAGNOSIS — D72829 Elevated white blood cell count, unspecified: Secondary | ICD-10-CM | POA: Insufficient documentation

## 2022-01-09 DIAGNOSIS — R7989 Other specified abnormal findings of blood chemistry: Secondary | ICD-10-CM

## 2022-01-09 DIAGNOSIS — R718 Other abnormality of red blood cells: Secondary | ICD-10-CM

## 2022-01-09 DIAGNOSIS — D509 Iron deficiency anemia, unspecified: Secondary | ICD-10-CM

## 2022-01-09 LAB — HEPATIC FUNCTION PANEL
ALT: 13 U/L (ref 7–35)
AST: 19 (ref 13–35)
Alkaline Phosphatase: 84 (ref 25–125)
Bilirubin, Total: 0.5

## 2022-01-09 LAB — BASIC METABOLIC PANEL
BUN: 9 (ref 4–21)
CO2: 25 — AB (ref 13–22)
Chloride: 102 (ref 99–108)
Creatinine: 0.6 (ref 0.5–1.1)
Glucose: 108
Potassium: 3.7 mEq/L (ref 3.5–5.1)
Sodium: 136 — AB (ref 137–147)

## 2022-01-09 LAB — IRON AND TIBC
Iron: 50 ug/dL (ref 28–170)
Saturation Ratios: 14 % (ref 10.4–31.8)
TIBC: 361 ug/dL (ref 250–450)
UIBC: 311 ug/dL

## 2022-01-09 LAB — CBC AND DIFFERENTIAL
HCT: 38 (ref 36–46)
Hemoglobin: 12 (ref 12.0–16.0)
Neutrophils Absolute: 10.34
Platelets: 491 10*3/uL — AB (ref 150–400)
WBC: 13.6

## 2022-01-09 LAB — CBC: RBC: 4.95 (ref 3.87–5.11)

## 2022-01-09 LAB — FERRITIN: Ferritin: 43 ng/mL (ref 11–307)

## 2022-01-09 LAB — COMPREHENSIVE METABOLIC PANEL
Albumin: 3.9 (ref 3.5–5.0)
Calcium: 9.1 (ref 8.7–10.7)

## 2022-01-09 NOTE — Telephone Encounter (Signed)
Per 01/09/22 next appt scheduled and confirmed with patient ?

## 2022-01-10 LAB — SOLUBLE TRANSFERRIN RECEPTOR: Transferrin Receptor: 23.2 nmol/L (ref 12.2–27.3)

## 2022-01-18 ENCOUNTER — Other Ambulatory Visit: Payer: Self-pay | Admitting: Oncology

## 2022-01-18 DIAGNOSIS — R718 Other abnormality of red blood cells: Secondary | ICD-10-CM

## 2022-01-20 LAB — ALPHA-THALASSEMIA GENOTYPR

## 2022-03-10 DIAGNOSIS — J019 Acute sinusitis, unspecified: Secondary | ICD-10-CM | POA: Diagnosis not present

## 2022-06-10 DIAGNOSIS — Z6841 Body Mass Index (BMI) 40.0 and over, adult: Secondary | ICD-10-CM | POA: Diagnosis not present

## 2022-06-10 DIAGNOSIS — E559 Vitamin D deficiency, unspecified: Secondary | ICD-10-CM | POA: Diagnosis not present

## 2022-06-10 DIAGNOSIS — R7309 Other abnormal glucose: Secondary | ICD-10-CM | POA: Diagnosis not present

## 2022-06-10 DIAGNOSIS — Z131 Encounter for screening for diabetes mellitus: Secondary | ICD-10-CM | POA: Diagnosis not present

## 2022-06-10 DIAGNOSIS — Z0000 Encounter for general adult medical examination without abnormal findings: Secondary | ICD-10-CM | POA: Diagnosis not present

## 2022-06-10 DIAGNOSIS — Z13220 Encounter for screening for lipoid disorders: Secondary | ICD-10-CM | POA: Diagnosis not present

## 2022-06-10 DIAGNOSIS — Z130 Encounter for screening for diseases of the blood and blood-forming organs and certain disorders involving the immune mechanism: Secondary | ICD-10-CM | POA: Diagnosis not present

## 2022-06-10 DIAGNOSIS — Z1329 Encounter for screening for other suspected endocrine disorder: Secondary | ICD-10-CM | POA: Diagnosis not present

## 2022-06-10 DIAGNOSIS — Z13228 Encounter for screening for other metabolic disorders: Secondary | ICD-10-CM | POA: Diagnosis not present

## 2022-06-10 DIAGNOSIS — D573 Sickle-cell trait: Secondary | ICD-10-CM | POA: Diagnosis not present

## 2022-06-10 DIAGNOSIS — D649 Anemia, unspecified: Secondary | ICD-10-CM | POA: Diagnosis not present

## 2022-07-10 DIAGNOSIS — R7303 Prediabetes: Secondary | ICD-10-CM | POA: Diagnosis not present

## 2022-07-10 DIAGNOSIS — Z01419 Encounter for gynecological examination (general) (routine) without abnormal findings: Secondary | ICD-10-CM | POA: Diagnosis not present

## 2022-07-10 DIAGNOSIS — E782 Mixed hyperlipidemia: Secondary | ICD-10-CM | POA: Diagnosis not present

## 2022-07-17 ENCOUNTER — Inpatient Hospital Stay: Payer: BC Managed Care – PPO

## 2022-07-17 ENCOUNTER — Ambulatory Visit: Payer: BC Managed Care – PPO | Admitting: Oncology

## 2022-07-17 DIAGNOSIS — Z1231 Encounter for screening mammogram for malignant neoplasm of breast: Secondary | ICD-10-CM | POA: Diagnosis not present

## 2022-08-07 ENCOUNTER — Ambulatory Visit: Payer: BC Managed Care – PPO | Admitting: Oncology

## 2022-08-07 ENCOUNTER — Other Ambulatory Visit: Payer: BC Managed Care – PPO

## 2022-09-03 DIAGNOSIS — E782 Mixed hyperlipidemia: Secondary | ICD-10-CM | POA: Diagnosis not present

## 2022-09-03 DIAGNOSIS — I493 Ventricular premature depolarization: Secondary | ICD-10-CM | POA: Diagnosis not present

## 2022-09-03 DIAGNOSIS — R002 Palpitations: Secondary | ICD-10-CM | POA: Diagnosis not present

## 2022-09-03 DIAGNOSIS — R7303 Prediabetes: Secondary | ICD-10-CM | POA: Diagnosis not present

## 2022-09-30 DIAGNOSIS — R002 Palpitations: Secondary | ICD-10-CM | POA: Diagnosis not present

## 2022-12-17 ENCOUNTER — Telehealth: Payer: Self-pay | Admitting: Oncology

## 2022-12-17 NOTE — Telephone Encounter (Signed)
12/17/22 Spoke with patient and confirmed next appt

## 2022-12-24 ENCOUNTER — Other Ambulatory Visit: Payer: Self-pay | Admitting: Oncology

## 2022-12-24 DIAGNOSIS — D509 Iron deficiency anemia, unspecified: Secondary | ICD-10-CM

## 2022-12-24 NOTE — Progress Notes (Signed)
[facility]  [address] [place],  [place]  [address] [phone]  Clinic Day:  01/09/2021  Referring physician: Ernestene Kiel, MD  HISTORY OF PRESENT ILLNESS:  The patient is a 47 y.o. female who I follow for leukocytosis.  She has also had a chronically elevated platelet count.  Of note, all studies done in the past to check for an underlying myeloproliferative disorder came back negative.  She also has microcytic red blood cell indices.  A hemoglobin electrophoresis in the past did not show any evidence of beta thalassemia. However, it did show that she has the sickle cell trait.  Furthermore, iron studies in the past were normal.  She comes in today to reassess her peripheral counts.    Since her last visit, the patient has been doing fine.  She denies having any recent infections or being on any steroids, both of which can cause leukocytosis.  She also continues to deny having any B symptoms which concern her for an underlying hematologic malignancy being behind her leukocytosis.    PHYSICAL EXAM:  unknown if currently breastfeeding. Wt Readings from Last 3 Encounters:  01/09/22 (!) 322 lb 9.6 oz (146.3 kg)  06/13/21 (!) 342 lb 4.8 oz (155.3 kg)  12/02/20 (!) 337 lb (152.9 kg)   There is no height or weight on file to calculate BMI. Performance status (ECOG): 0 - Asymptomatic Physical Exam Constitutional:      Appearance: Normal appearance. She is not ill-appearing.  HENT:     Mouth/Throat:     Mouth: Mucous membranes are moist.     Pharynx: Oropharynx is clear. No oropharyngeal exudate or posterior oropharyngeal erythema.  Cardiovascular:     Rate and Rhythm: Normal rate and regular rhythm.     Heart sounds: No murmur heard.    No friction rub. No gallop.  Pulmonary:     Effort: Pulmonary effort is normal. No respiratory distress.     Breath sounds: Normal breath sounds. No wheezing, rhonchi or rales.  Abdominal:     General: Bowel  sounds are normal. There is no distension.     Palpations: Abdomen is soft. There is no mass.     Tenderness: There is no abdominal tenderness.  Musculoskeletal:        General: No swelling.     Right lower leg: No edema.     Left lower leg: No edema.  Lymphadenopathy:     Cervical: No cervical adenopathy.     Upper Body:     Right upper body: No supraclavicular or axillary adenopathy.     Left upper body: No supraclavicular or axillary adenopathy.     Lower Body: No right inguinal adenopathy. No left inguinal adenopathy.  Skin:    General: Skin is warm.     Coloration: Skin is not jaundiced.     Findings: No lesion or rash.  Neurological:     General: No focal deficit present.     Mental Status: She is alert and oriented to person, place, and time. Mental status is at baseline.  Psychiatric:        Mood and Affect: Mood normal.        Behavior: Behavior normal.        Thought Content: Thought content normal.    LABS:    Test: Alpha-Thalassemia, DNA Analysis  Result:     Negative, alpha alpha/alpha alpha  Interpretation:  No deletions were detected within the alpha globin cluster  region. Additionally,  this individual is negative for the  Hemoglobin [place] mutation. The presence of other  non-deletional mutations, like point mutations, in the  alpha-globin gene cannot be excluded as the assay does not  detect these changes.      ASSESSMENT & PLAN:  A 47 y.o. female with leukocytosis, thrombocythemia and microcytic red blood cell indices.  I am pleased as her both her white cells and platelets are lower than what they were previously.  With respect to her microcytic red blood cell indices, her iron parameters today continue to show no evidence of iron deficiency being present.  A hemoglobin electrophoresis done recently did not show any evidence of beta thalassemia being the reason behind her microcytic red cell indices.  Based upon this, I will also check her for  alpha thalassemia today.  Otherwise, this patient does not have any ominous hematologic issues at this time.  Clinically, she is doing well.  I will see her back in 6 months for repeat clinical assessment.  The patient understands all the plans discussed today and is in agreement with them.   Eriberto Felch Macarthur Critchley, MD

## 2022-12-25 ENCOUNTER — Inpatient Hospital Stay: Payer: Managed Care, Other (non HMO)

## 2022-12-25 ENCOUNTER — Other Ambulatory Visit: Payer: Self-pay | Admitting: Oncology

## 2022-12-25 ENCOUNTER — Inpatient Hospital Stay: Payer: Managed Care, Other (non HMO) | Attending: Oncology | Admitting: Oncology

## 2022-12-25 VITALS — BP 185/87 | HR 88 | Temp 97.9°F | Resp 16 | Ht 64.5 in | Wt 318.9 lb

## 2022-12-25 DIAGNOSIS — D72823 Leukemoid reaction: Secondary | ICD-10-CM

## 2022-12-25 DIAGNOSIS — D509 Iron deficiency anemia, unspecified: Secondary | ICD-10-CM

## 2022-12-25 DIAGNOSIS — D72829 Elevated white blood cell count, unspecified: Secondary | ICD-10-CM

## 2022-12-25 LAB — CBC AND DIFFERENTIAL
HCT: 36 (ref 36–46)
Hemoglobin: 12.1 (ref 12.0–16.0)
Neutrophils Absolute: 4.49
Platelets: 424 10*3/uL — AB (ref 150–400)
WBC: 6.9

## 2022-12-25 LAB — CBC: RBC: 4.7 (ref 3.87–5.11)

## 2023-01-01 ENCOUNTER — Encounter: Payer: Self-pay | Admitting: Oncology

## 2023-12-23 ENCOUNTER — Other Ambulatory Visit: Payer: Self-pay | Admitting: Oncology

## 2023-12-23 ENCOUNTER — Other Ambulatory Visit: Payer: Self-pay

## 2023-12-23 DIAGNOSIS — D72829 Elevated white blood cell count, unspecified: Secondary | ICD-10-CM

## 2023-12-23 DIAGNOSIS — D509 Iron deficiency anemia, unspecified: Secondary | ICD-10-CM

## 2023-12-23 NOTE — Progress Notes (Signed)
 [facility]  [address] [place],  [place]  [address] [phone]  Clinic Day:  01/09/2021  Referring physician: Philemon Kingdom, MD  HISTORY OF PRESENT ILLNESS:  The patient is a 48 y.o. female who I follow for leukocytosis.  She has also had a chronically elevated platelet count.  Of note, all studies done in the past to check for an underlying myeloproliferative disorder came back negative.  She also has microcytic red blood cell indices.  A hemoglobin electrophoresis in the past did not show any evidence of beta thalassemia. However, it did show that she has the sickle cell trait.  Furthermore, iron studies in the past were normal.  She comes in today to reassess her peripheral counts.    Since her last visit, the patient has been doing fine.  She denies having any recent infections or being on any steroids, both of which can cause leukocytosis.  She also continues to deny having any B symptoms which concern her for an underlying hematologic malignancy being behind her leukocytosis.    PHYSICAL EXAM:  unknown if currently breastfeeding. Wt Readings from Last 3 Encounters:  12/25/22 (!) 318 lb 14.4 oz (144.7 kg)  01/09/22 (!) 322 lb 9.6 oz (146.3 kg)  06/13/21 (!) 342 lb 4.8 oz (155.3 kg)   There is no height or weight on file to calculate BMI. Performance status (ECOG): 0 - Asymptomatic Physical Exam Constitutional:      Appearance: Normal appearance. She is not ill-appearing.  HENT:     Mouth/Throat:     Mouth: Mucous membranes are moist.     Pharynx: Oropharynx is clear. No oropharyngeal exudate or posterior oropharyngeal erythema.  Cardiovascular:     Rate and Rhythm: Normal rate and regular rhythm.     Heart sounds: No murmur heard.    No friction rub. No gallop.  Pulmonary:     Effort: Pulmonary effort is normal. No respiratory distress.     Breath sounds: Normal breath sounds. No wheezing, rhonchi or rales.  Abdominal:      General: Bowel sounds are normal. There is no distension.     Palpations: Abdomen is soft. There is no mass.     Tenderness: There is no abdominal tenderness.  Musculoskeletal:        General: No swelling.     Right lower leg: No edema.     Left lower leg: No edema.  Lymphadenopathy:     Cervical: No cervical adenopathy.     Upper Body:     Right upper body: No supraclavicular or axillary adenopathy.     Left upper body: No supraclavicular or axillary adenopathy.     Lower Body: No right inguinal adenopathy. No left inguinal adenopathy.  Skin:    General: Skin is warm.     Coloration: Skin is not jaundiced.     Findings: No lesion or rash.  Neurological:     General: No focal deficit present.     Mental Status: She is alert and oriented to person, place, and time. Mental status is at baseline.  Psychiatric:        Mood and Affect: Mood normal.        Behavior: Behavior normal.        Thought Content: Thought content normal.     LABS:  Test: Alpha-Thalassemia, DNA Analysis  Result:     Negative, alpha alpha/alpha alpha  Interpretation:  No deletions were detected within the alpha globin cluster  region.  Additionally, this individual is negative for the  Hemoglobin [place] mutation. The presence of other  non-deletional mutations, like point mutations, in the  alpha-globin gene cannot be excluded as the assay does not  detect these changes.      ASSESSMENT & PLAN:  A 48 y.o. female with leukocytosis, thrombocythemia and microcytic red blood cell indices.  I am pleased as her both her white cells and platelets are lower than what they were previously.  With respect to her microcytic red blood cell indices, her iron parameters today continue to show no evidence of iron deficiency being present.  A hemoglobin electrophoresis done recently did not show any evidence of beta thalassemia being the reason behind her microcytic red cell indices.  Based upon this, I will also  check her for alpha thalassemia today.  Otherwise, this patient does not have any ominous hematologic issues at this time.  Clinically, she is doing well.  I will see her back in 6 months for repeat clinical assessment.  The patient understands all the plans discussed today and is in agreement with them.   Rodrigo Mcgranahan Kirby Funk, MD

## 2023-12-24 ENCOUNTER — Inpatient Hospital Stay: Payer: Managed Care, Other (non HMO)

## 2023-12-24 ENCOUNTER — Other Ambulatory Visit: Payer: Self-pay | Admitting: Oncology

## 2023-12-24 ENCOUNTER — Telehealth: Payer: Self-pay | Admitting: Oncology

## 2023-12-24 ENCOUNTER — Inpatient Hospital Stay: Payer: Managed Care, Other (non HMO) | Attending: Oncology | Admitting: Oncology

## 2023-12-24 VITALS — BP 150/81 | HR 89 | Temp 98.1°F | Resp 16 | Ht 64.5 in | Wt 326.5 lb

## 2023-12-24 DIAGNOSIS — D509 Iron deficiency anemia, unspecified: Secondary | ICD-10-CM

## 2023-12-24 DIAGNOSIS — D72829 Elevated white blood cell count, unspecified: Secondary | ICD-10-CM | POA: Insufficient documentation

## 2023-12-24 DIAGNOSIS — D75839 Thrombocytosis, unspecified: Secondary | ICD-10-CM | POA: Insufficient documentation

## 2023-12-24 DIAGNOSIS — D573 Sickle-cell trait: Secondary | ICD-10-CM | POA: Diagnosis not present

## 2023-12-24 LAB — CBC WITH DIFFERENTIAL (CANCER CENTER ONLY)
Abs Immature Granulocytes: 0.05 10*3/uL (ref 0.00–0.07)
Basophils Absolute: 0.1 10*3/uL (ref 0.0–0.1)
Basophils Relative: 1 %
Eosinophils Absolute: 0.3 10*3/uL (ref 0.0–0.5)
Eosinophils Relative: 2 %
HCT: 36.9 % (ref 36.0–46.0)
Hemoglobin: 12.8 g/dL (ref 12.0–15.0)
Immature Granulocytes: 0 %
Lymphocytes Relative: 28 %
Lymphs Abs: 3.1 10*3/uL (ref 0.7–4.0)
MCH: 26.1 pg (ref 26.0–34.0)
MCHC: 34.7 g/dL (ref 30.0–36.0)
MCV: 75.3 fL — ABNORMAL LOW (ref 80.0–100.0)
Monocytes Absolute: 0.7 10*3/uL (ref 0.1–1.0)
Monocytes Relative: 6 %
Neutro Abs: 7.1 10*3/uL (ref 1.7–7.7)
Neutrophils Relative %: 63 %
Platelet Count: 438 10*3/uL — ABNORMAL HIGH (ref 150–400)
RBC: 4.9 MIL/uL (ref 3.87–5.11)
RDW: 14.4 % (ref 11.5–15.5)
WBC Count: 11.2 10*3/uL — ABNORMAL HIGH (ref 4.0–10.5)
nRBC: 0 % (ref 0.0–0.2)
nRBC: 0 /100{WBCs}

## 2023-12-24 LAB — IRON AND TIBC
Iron: 48 ug/dL (ref 28–170)
Saturation Ratios: 13 % (ref 10.4–31.8)
TIBC: 365 ug/dL (ref 250–450)
UIBC: 317 ug/dL

## 2023-12-24 LAB — FERRITIN: Ferritin: 63 ng/mL (ref 11–307)

## 2023-12-24 NOTE — Telephone Encounter (Signed)
 12/24/23 Spoke with patient and confirmed next appt.

## 2023-12-25 NOTE — Progress Notes (Signed)
 [facility]  [address] [place],  [place]  [address] [phone]  Clinic Day:  12/24/2023  Referring physician: Philemon Kingdom, MD   HISTORY OF PRESENT ILLNESS:  The patient is a 48 y.o. female with The patient is a 48 y.o. female who I follow for leukocytosis.  She has also had a chronically elevated platelet count.  Of note, all studies done in the past to check for an underlying myeloproliferative disorder came back negative.  She also has microcytic red blood cell indices.  A hemoglobin electrophoresis in the past did not show any evidence of beta thalassemia. However, it did show that she has the sickle cell trait.  She also tested negative for alpha thalassemia.  Iron studies in the past have been normal.  She comes in today to reassess her peripheral counts.    Since her last visit, the patient has been doing fine.  She denies having any recent infections or being on any steroids, both of which can cause leukocytosis.  She also continues to deny having any B symptoms which concern her for an underlying hematologic malignancy being behind either her leukocytosis or elevated platelets.     PHYSICAL EXAM:  Blood pressure (!) 150/81, pulse 89, temperature 98.1 F (36.7 C), temperature source Oral, resp. rate 16, height 5' 4.5" (1.638 m), weight (!) 326 lb 8 oz (148.1 kg), SpO2 100%, unknown if currently breastfeeding. Wt Readings from Last 3 Encounters:  12/24/23 (!) 326 lb 8 oz (148.1 kg)  12/25/22 (!) 318 lb 14.4 oz (144.7 kg)  01/09/22 (!) 322 lb 9.6 oz (146.3 kg)   Body mass index is 55.18 kg/m. Performance status (ECOG): 0 - Asymptomatic Physical Exam Constitutional:      Appearance: Normal appearance. She is not ill-appearing.  HENT:     Mouth/Throat:     Mouth: Mucous membranes are moist.     Pharynx: Oropharynx is clear. No oropharyngeal exudate or posterior oropharyngeal erythema.  Cardiovascular:     Rate and Rhythm: Normal rate  and regular rhythm.     Heart sounds: No murmur heard.    No friction rub. No gallop.  Pulmonary:     Effort: Pulmonary effort is normal. No respiratory distress.     Breath sounds: Normal breath sounds. No wheezing, rhonchi or rales.  Abdominal:     General: Bowel sounds are normal. There is no distension.     Palpations: Abdomen is soft. There is no mass.     Tenderness: There is no abdominal tenderness.  Musculoskeletal:        General: No swelling.     Right lower leg: No edema.     Left lower leg: No edema.  Lymphadenopathy:     Cervical: No cervical adenopathy.     Upper Body:     Right upper body: No supraclavicular or axillary adenopathy.     Left upper body: No supraclavicular or axillary adenopathy.     Lower Body: No right inguinal adenopathy. No left inguinal adenopathy.  Skin:    General: Skin is warm.     Coloration: Skin is not jaundiced.     Findings: No lesion or rash.  Neurological:     General: No focal deficit present.     Mental Status: She is alert and oriented to person, place, and time. Mental status is at baseline.  Psychiatric:        Mood and Affect: Mood normal.  Behavior: Behavior normal.        Thought Content: Thought content normal.   LABS:      Latest Ref Rng & Units 12/24/2023    1:00 PM 12/25/2022   12:00 AM 01/09/2022   12:00 AM  CBC  WBC 4.0 - 10.5 K/uL 11.2  6.9     13.6      Hemoglobin 12.0 - 15.0 g/dL 40.9  81.1     91.4      Hematocrit 36.0 - 46.0 % 36.9  36     38      Platelets 150 - 400 K/uL 438  424     491         This result is from an external source.      Latest Ref Rng & Units 01/09/2022   12:00 AM  CMP  BUN 4 - 21 9      Creatinine 0.5 - 1.1 0.6      Sodium 137 - 147 136      Potassium 3.5 - 5.1 mEq/L 3.7      Chloride 99 - 108 102      CO2 13 - 22 25      Calcium 8.7 - 10.7 9.1      Alkaline Phos 25 - 125 84      AST 13 - 35 19      ALT 7 - 35 U/L 13         This result is from an external source.     Latest Reference Range & Units 12/24/23 13:00  Iron 28 - 170 ug/dL 48  UIBC ug/dL 782  TIBC 956 - 213 ug/dL 086  Saturation Ratios 10.4 - 31.8 % 13  Ferritin 11 - 307 ng/mL 63   ASSESSMENT & PLAN:  Assessment/Plan:  A 48 y.o. female with leukocytosis, thrombocythemia and microcytic red blood cell indices. Her both her white cells and platelets are slightly elevated.  However, none of her peripheral counts is much different than what they have been previously.  With respect to her microcytic red blood cell indices, her iron parameters today continue to show no evidence of iron deficiency being present.  Clinically, she is doing well. I will see her back in 1  year for repeat clinical assessment.  The patient understands all the plans discussed today and is in agreement with them.    Apryle Stowell Kirby Funk, MD

## 2024-12-22 ENCOUNTER — Other Ambulatory Visit

## 2024-12-22 ENCOUNTER — Ambulatory Visit: Admitting: Oncology
